# Patient Record
Sex: Male | Born: 1945 | Race: White | Hispanic: No | Marital: Married | Smoking: Never smoker
Health system: Southern US, Community
[De-identification: ages and names within clinical notes are randomized; demographics above are authoritative.]

## PROBLEM LIST (undated history)

## (undated) DIAGNOSIS — K219 Gastro-esophageal reflux disease without esophagitis: Secondary | ICD-10-CM

## (undated) DIAGNOSIS — M353 Polymyalgia rheumatica: Secondary | ICD-10-CM

## (undated) DIAGNOSIS — R413 Other amnesia: Principal | ICD-10-CM

## (undated) DIAGNOSIS — C61 Malignant neoplasm of prostate: Secondary | ICD-10-CM

## (undated) DIAGNOSIS — G709 Myoneural disorder, unspecified: Secondary | ICD-10-CM

## (undated) DIAGNOSIS — D689 Coagulation defect, unspecified: Secondary | ICD-10-CM

## (undated) DIAGNOSIS — R29898 Other symptoms and signs involving the musculoskeletal system: Secondary | ICD-10-CM

## (undated) DIAGNOSIS — I341 Nonrheumatic mitral (valve) prolapse: Secondary | ICD-10-CM

## (undated) DIAGNOSIS — M1990 Unspecified osteoarthritis, unspecified site: Secondary | ICD-10-CM

## (undated) HISTORY — DX: Gastro-esophageal reflux disease without esophagitis: K21.9

## (undated) HISTORY — DX: Unspecified osteoarthritis, unspecified site: M19.90

## (undated) HISTORY — DX: Malignant neoplasm of prostate: C61

## (undated) HISTORY — PX: EYE SURGERY: SHX253

## (undated) HISTORY — DX: Other symptoms and signs involving the musculoskeletal system: R29.898

## (undated) HISTORY — DX: Coagulation defect, unspecified: D68.9

## (undated) HISTORY — DX: Other amnesia: R41.3

## (undated) HISTORY — PX: SPINE SURGERY: SHX786

## (undated) HISTORY — PX: PILONIDAL CYST EXCISION: SHX744

---
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
## 1989-01-03 HISTORY — PX: VASECTOMY: SHX75

## 1993-01-03 HISTORY — PX: BACK SURGERY: SHX140

## 1996-01-04 HISTORY — PX: TRANSURETHRAL RESECTION OF PROSTATE: SHX73

## 1998-09-06 ENCOUNTER — Ambulatory Visit (HOSPITAL_COMMUNITY): Admission: RE | Admit: 1998-09-06 | Discharge: 1998-09-06 | Payer: Self-pay | Admitting: Neurosurgery

## 1998-09-06 ENCOUNTER — Encounter: Payer: Self-pay | Admitting: Neurosurgery

## 1999-03-08 ENCOUNTER — Other Ambulatory Visit: Admission: RE | Admit: 1999-03-08 | Discharge: 1999-03-08 | Payer: Self-pay | Admitting: Obstetrics & Gynecology

## 2000-01-04 HISTORY — PX: ROTATOR CUFF REPAIR: SHX139

## 2003-01-04 HISTORY — PX: PROSTATE SURGERY: SHX751

## 2003-10-28 ENCOUNTER — Emergency Department (HOSPITAL_COMMUNITY): Admission: EM | Admit: 2003-10-28 | Discharge: 2003-10-28 | Payer: Self-pay | Admitting: Emergency Medicine

## 2009-08-11 ENCOUNTER — Encounter: Admission: RE | Admit: 2009-08-11 | Discharge: 2009-08-11 | Payer: Self-pay | Admitting: Internal Medicine

## 2010-09-01 ENCOUNTER — Other Ambulatory Visit: Payer: Self-pay | Admitting: Neurology

## 2010-09-01 DIAGNOSIS — R51 Headache: Secondary | ICD-10-CM

## 2010-09-01 DIAGNOSIS — R42 Dizziness and giddiness: Secondary | ICD-10-CM

## 2010-09-03 ENCOUNTER — Ambulatory Visit
Admission: RE | Admit: 2010-09-03 | Discharge: 2010-09-03 | Disposition: A | Discharge status: Home / Self Care | Payer: Medicare Other | Source: Ambulatory Visit | Attending: Neurology | Admitting: Neurology

## 2010-09-03 DIAGNOSIS — R42 Dizziness and giddiness: Secondary | ICD-10-CM

## 2010-09-03 DIAGNOSIS — R51 Headache: Secondary | ICD-10-CM

## 2010-09-16 ENCOUNTER — Other Ambulatory Visit: Payer: Self-pay | Admitting: Neurology

## 2010-09-16 DIAGNOSIS — R519 Headache, unspecified: Secondary | ICD-10-CM

## 2010-09-16 DIAGNOSIS — R42 Dizziness and giddiness: Secondary | ICD-10-CM

## 2010-09-23 ENCOUNTER — Ambulatory Visit
Admission: RE | Admit: 2010-09-23 | Discharge: 2010-09-23 | Disposition: A | Discharge status: Home / Self Care | Payer: Medicare Other | Source: Ambulatory Visit | Attending: Neurology | Admitting: Neurology

## 2010-09-23 DIAGNOSIS — R519 Headache, unspecified: Secondary | ICD-10-CM

## 2010-09-23 DIAGNOSIS — R42 Dizziness and giddiness: Secondary | ICD-10-CM

## 2010-09-23 MED ORDER — GADOBENATE DIMEGLUMINE 529 MG/ML IV SOLN
15.0000 mL | Freq: Once | INTRAVENOUS | Status: AC | PRN
  Administered 2010-09-23: 15 mL via INTRAVENOUS

## 2013-04-19 ENCOUNTER — Other Ambulatory Visit: Payer: Self-pay | Admitting: Orthopedic Surgery

## 2013-04-22 NOTE — Progress Notes (Signed)
Pt aware of where to come-bring meds and ins/id

## 2013-04-24 NOTE — H&P (Signed)
   Jay Ayers is a 68 year old retired [employer]. He has had a mass beneath his right thumb nail on the radial sterile matrix dating back to June of 2014. He has been treated with an antifungal without success. He has a mass consistent with either a verrucous lesion or possibly a squamous cell skin cancer.   He is sent for a surgical opinion regarding possible biopsy or other treatment alternatives.   His past history is reviewed in detail. He is 5'9", 150 lbs. He is not taking any prescription medication for his thumb nail predicament. He reports no drug allergies. Current medications include Prednisone 5 mg daily, Celebrex 200 mg daily, and skin lotions. Prior surgery includes appendectomy, pilonidal cyst excision, prostatectomy, tonsillectomy, and shoulder surgery. His social history reveals he is married. He is a nonsmoker. He enjoys a rare alcoholic beverage. His family history is detailed and positive for primary relatives with high BP, coronary artery disease and arthritis. A 14 system review of systems reveals corrective lenses, hearing impairment, cough, easy bleeding and bruising.  Physical examination reveals a very pleasant 68 year old gentleman. Inspection of his thumb reveals an irregular mass beneath the radial nail plate. He does have a recess and scarring of his sterile matrix. His nail plate appears to be growing normally from beneath the nail fold. He has full ROM of his MP and IP joints.  We had a detailed discussion regarding his predicament. The differential diagnosis includes a verrucous lesion, a foreign body or another soft tissue tumor only able to be diagnosed by a proper biopsy and a chronic fungal infection.   I have advised him it is in his best interest to proceed with biopsy of his nail mass under local anesthesia.  We will remove the nail plate, perform a biopsy and appropriately repair his nail matrix.   I have explained to him that he will never have a normal nail  plate/nail fold despite our best effort. He may have a residual area of scarred sterile nail matrix that will have difficulties with adherence of the nail plate and could have foreign material collect.   Our main goal is to obtain a proper diagnosis so that we may prescribe optimum treatment.

## 2013-04-25 ENCOUNTER — Encounter (HOSPITAL_BASED_OUTPATIENT_CLINIC_OR_DEPARTMENT_OTHER)
Admission: RE | Disposition: A | Discharge status: Home / Self Care | Payer: Self-pay | Source: Ambulatory Visit | Attending: Orthopedic Surgery

## 2013-04-25 ENCOUNTER — Ambulatory Visit (HOSPITAL_BASED_OUTPATIENT_CLINIC_OR_DEPARTMENT_OTHER)
Admission: RE | Admit: 2013-04-25 | Discharge: 2013-04-25 | Disposition: A | Discharge status: Home / Self Care | Payer: Medicare Other | Source: Ambulatory Visit | Attending: Orthopedic Surgery | Admitting: Orthopedic Surgery

## 2013-04-25 ENCOUNTER — Encounter (HOSPITAL_BASED_OUTPATIENT_CLINIC_OR_DEPARTMENT_OTHER): Payer: Self-pay

## 2013-04-25 DIAGNOSIS — B351 Tinea unguium: Secondary | ICD-10-CM | POA: Insufficient documentation

## 2013-04-25 HISTORY — DX: Myoneural disorder, unspecified: G70.9

## 2013-04-25 HISTORY — PX: MINOR NAILBED REPAIR: SHX5979

## 2013-04-25 SURGERY — MINOR NAILBED REPAIR
Anesthesia: LOCAL | Site: Thumb | Laterality: Right

## 2013-04-25 MED ORDER — LIDOCAINE HCL 2 % IJ SOLN
INTRAMUSCULAR | Status: DC | PRN
  Administered 2013-04-25: 5 mL

## 2013-04-25 MED ORDER — ACETAMINOPHEN-CODEINE #3 300-30 MG PO TABS: 1.0000 | ORAL_TABLET | ORAL | Status: DC | PRN

## 2013-04-25 SURGICAL SUPPLY — 46 items
BANDAGE ADH SHEER 1  50/CT (GAUZE/BANDAGES/DRESSINGS) IMPLANT
BANDAGE COBAN STERILE 2 (GAUZE/BANDAGES/DRESSINGS) IMPLANT
BLADE 15 SAFETY STRL DISP (BLADE) ×2 IMPLANT
BLADE SURG 15 STRL LF DISP TIS (BLADE) ×1 IMPLANT
BLADE SURG 15 STRL SS (BLADE) ×1
BNDG COHESIVE 1X5 TAN STRL LF (GAUZE/BANDAGES/DRESSINGS) ×2 IMPLANT
BNDG ELASTIC 2 VLCR STRL LF (GAUZE/BANDAGES/DRESSINGS) IMPLANT
BNDG ESMARK 4X9 LF (GAUZE/BANDAGES/DRESSINGS) IMPLANT
BRUSH SCRUB EZ PLAIN DRY (MISCELLANEOUS) ×2 IMPLANT
CORDS BIPOLAR (ELECTRODE) IMPLANT
COVER MAYO STAND STRL (DRAPES) ×2 IMPLANT
CUFF TOURNIQUET SINGLE 18IN (TOURNIQUET CUFF) IMPLANT
DECANTER SPIKE VIAL GLASS SM (MISCELLANEOUS) IMPLANT
DRAIN PENROSE 1/2X12 LTX STRL (WOUND CARE) ×2 IMPLANT
DRAPE SURG 17X23 STRL (DRAPES) ×2 IMPLANT
GAUZE XEROFORM 1X8 LF (GAUZE/BANDAGES/DRESSINGS) ×2 IMPLANT
GLOVE BIO SURGEON STRL SZ 6.5 (GLOVE) ×2 IMPLANT
GLOVE BIO SURGEON STRL SZ7.5 (GLOVE) ×2 IMPLANT
GLOVE BIOGEL M STRL SZ7.5 (GLOVE) IMPLANT
GLOVE BIOGEL PI IND STRL 7.0 (GLOVE) ×1 IMPLANT
GLOVE BIOGEL PI IND STRL 8 (GLOVE) ×1 IMPLANT
GLOVE BIOGEL PI INDICATOR 7.0 (GLOVE) ×1
GLOVE BIOGEL PI INDICATOR 8 (GLOVE) ×1
GLOVE ECLIPSE 6.5 STRL STRAW (GLOVE) ×2 IMPLANT
GLOVE ORTHO TXT STRL SZ7.5 (GLOVE) ×2 IMPLANT
GOWN STRL REUS W/ TWL LRG LVL3 (GOWN DISPOSABLE) ×2 IMPLANT
GOWN STRL REUS W/ TWL XL LVL3 (GOWN DISPOSABLE) ×1 IMPLANT
GOWN STRL REUS W/TWL LRG LVL3 (GOWN DISPOSABLE) ×2
GOWN STRL REUS W/TWL XL LVL3 (GOWN DISPOSABLE) ×1
NEEDLE 27GAX1X1/2 (NEEDLE) ×2 IMPLANT
PACK BASIN DAY SURGERY FS (CUSTOM PROCEDURE TRAY) ×2 IMPLANT
PADDING CAST ABS 4INX4YD NS (CAST SUPPLIES)
PADDING CAST ABS COTTON 4X4 ST (CAST SUPPLIES) IMPLANT
SPONGE GAUZE 4X4 12PLY (GAUZE/BANDAGES/DRESSINGS) ×2 IMPLANT
SPONGE GAUZE 4X4 12PLY STER LF (GAUZE/BANDAGES/DRESSINGS) IMPLANT
STOCKINETTE 4X48 STRL (DRAPES) ×2 IMPLANT
STRIP CLOSURE SKIN 1/2X4 (GAUZE/BANDAGES/DRESSINGS) IMPLANT
SUT CHROMIC 6 0 PS 4 (SUTURE) ×2 IMPLANT
SUT ETHILON 5 0 P 3 18 (SUTURE)
SUT NYLON ETHILON 5-0 P-3 1X18 (SUTURE) IMPLANT
SUT PROLENE 4 0 P 3 18 (SUTURE) IMPLANT
SYR 3ML 23GX1 SAFETY (SYRINGE) IMPLANT
SYR CONTROL 10ML LL (SYRINGE) ×2 IMPLANT
TOWEL OR 17X24 6PK STRL BLUE (TOWEL DISPOSABLE) ×4 IMPLANT
TRAY DSU PREP LF (CUSTOM PROCEDURE TRAY) ×2 IMPLANT
UNDERPAD 30X30 INCONTINENT (UNDERPADS AND DIAPERS) ×2 IMPLANT

## 2013-04-25 NOTE — Brief Op Note (Signed)
04/25/2013  12:01 PM  PATIENT:  Jay Ayers  68 y.o. male  PRE-OPERATIVE DIAGNOSIS:  right thumb nail lesion verruca?  POST-OPERATIVE DIAGNOSIS:  right thumb nail lesion verruca vs mitotic lesion  PROCEDURE:  Procedure(s) with comments: RIGHT THUMB NAIL BIOPSY    (MINOR PROCEDURE) (Right) - clean class  SURGEON:  Surgeon(s) and Role:    * Cammie Sickle., MD - Primary  PHYSICIAN ASSISTANT:   ASSISTANTS: nurse   ANESTHESIA:   local  EBL:     BLOOD ADMINISTERED:none  DRAINS: none   LOCAL MEDICATIONS USED:  XYLOCAINE   SPECIMEN:  Biopsy / Limited Resection  DISPOSITION OF SPECIMEN:  PATHOLOGY  COUNTS:  YES  TOURNIQUET:    DICTATION: .Other Dictation: Dictation Number [id number]  PLAN OF CARE: Discharge to home after PACU  PATIENT DISPOSITION:  PACU - hemodynamically stable.   Delay start of Pharmacological VTE agent (>24hrs) due to surgical blood loss or risk of bleeding: not applicable

## 2013-04-25 NOTE — Op Note (Signed)
[id number]  

## 2013-04-25 NOTE — Discharge Instructions (Signed)

## 2013-04-26 NOTE — Op Note (Signed)
[name], QUERRY NO.:  [id number]  MEDICAL RECORD NO.:  [id number]  LOCATION:                                 FACILITY:  PHYSICIAN:  Jay Ayers, M.D.      DATE OF BIRTH:  DATE OF PROCEDURE:  04/25/2013 DATE OF DISCHARGE:                              OPERATIVE REPORT   PREOPERATIVE DIAGNOSIS:  Subungual lesion, right thumb nail causing nail deformity, rule out chronic fungal infection, rule out verruca, and rule out mitotic lesion.  POSTOPERATIVE DIAGNOSIS:  Uncertain pending histopathologic evaluation of biopsy.  OPERATION:  Full-thickness nail matrix biopsy right thumb with reconstruction of sterile nail matrix.  OPERATING SURGEON:  Jay Ayers, M.D.  ASSISTANT:  Nurse.  ANESTHESIA:  2% lidocaine metacarpal head block, right thumb.  This was performed as a minor operating procedure.  ANESTHETIST:  Jay Ayers, M.D.  INDICATIONS:  Jay Ayers is a 68 year old gentleman referred through the courtesy of Dr. Harriett Ayers of [facility] for evaluation and management of a subungual lesion of the right thumb nail.  Jay Ayers has had an abnormal appearing nail for multiple months with separation of the nail plate from the nail fold and a keratinous plug beneath the nail plate.  He was evaluated by Dr. Elvera Ayers and sent for a hand surgery consult.  We advised nail removal, biopsy of the nail matrix, and appropriate histopathology, and PAS stains.  Jay Ayers presents for biopsy under local anesthesia conditions at this time.  Preoperatively, he was interviewed in the holding area.  His right hand and arm were identified as the proper surgical site.  Following informed consent and Betadine prep of his hand, 2% lidocaine was infiltrated at metacarpal head level to obtain a digital block.  After 10 minutes, excellent anesthesia was achieved.  The right hand and arm were then prepped with Betadine soap  and solution, sterilely draped.  The thumb was exsanguinated with a gauze wrap and direct compression followed by application of a half-inch Penrose drain at the base of the proximal phalanx as a digital tourniquet.  Following routine surgical time-out, the nail was removed with a Soil scientist and hemostat, the nail plate was cleared of hypertrophic keratinous material and soaked in Betadine.  We identified the lesion and performed a marginal resection of the entire Jay Ayers is plugged as well as the matrix proximal to the plug.  This was passed directly in formalin for pathologic evaluation.  A small area proximal to the plug was excised to see if there were a histopathologic changes of the germinal nail matrix.  The sterile matrix was then elevated with a scalpel and repaired primarily with interrupted suture of 6-0 chromic.  Anatomic repair was achieved.  Due to the fact that the nail plate was involved and appeared to have some fungal type changes, I elected to discard the nail plate and dress the sterile matrix with Xeroflo.  There were no apparent complications.  Jay Ayers' thumb was then placed in compressive dressing with sterile gauze and Coban.  He is encouraged to elevate his hand for 24 hours.  He is provided prescription for Tylenol with Codeine No. 3  one or two p.o. q.4-6 hours p.r.n. pain, 30 tablets without refill.  We will see him back for followup in 5 days for a dressing change and discussion of his biopsy results.     Jay Ayers, M.D.     RVS/MEDQ  D:  04/25/2013  T:  04/26/2013  Job:  [id number]  cc:   Jay Sine, MD

## 2013-04-30 ENCOUNTER — Encounter (HOSPITAL_BASED_OUTPATIENT_CLINIC_OR_DEPARTMENT_OTHER): Payer: Self-pay | Admitting: Orthopedic Surgery

## 2013-08-06 ENCOUNTER — Ambulatory Visit (INDEPENDENT_AMBULATORY_CARE_PROVIDER_SITE_OTHER): Payer: Medicare Other | Admitting: Neurology

## 2013-08-06 ENCOUNTER — Encounter: Payer: Self-pay | Admitting: Neurology

## 2013-08-06 VITALS — BP 114/66 | HR 64 | Ht 70.0 in | Wt 154.0 lb

## 2013-08-06 DIAGNOSIS — D513 Other dietary vitamin B12 deficiency anemia: Secondary | ICD-10-CM

## 2013-08-06 DIAGNOSIS — R413 Other amnesia: Secondary | ICD-10-CM | POA: Insufficient documentation

## 2013-08-06 DIAGNOSIS — D518 Other vitamin B12 deficiency anemias: Secondary | ICD-10-CM

## 2013-08-06 DIAGNOSIS — R6889 Other general symptoms and signs: Secondary | ICD-10-CM

## 2013-08-06 HISTORY — DX: Other amnesia: R41.3

## 2013-08-06 NOTE — Progress Notes (Signed)
Reason for visit: Memory disturbance  Jay Ayers is a 68 y.o. male  History of present illness:  Jay Ayers is a 68 year old right-handed white male with a history of a progressive memory disturbance that has been present for least one to 2 years. The patient has had increasing problems with remembering names for people, and keeping up with appointments. He has had problems with math functions, and misplacing things about the house. While driving, he finds that he needs to focus harder in order to maintain safety. The patient finds that he is more distractible. He does not remember conversations that occurred with other family members. While reading books, he has difficulty keeping up with the plot and various characters in the book. The patient does have some slight fatigue, and he gives a history of polymyalgia rheumatica, and he indicates that he recently tapered off of his prednisone. The patient has had some recent headaches, but he attributes this to a new prescription for glasses. He continues to have dizziness for which he was seen in August of 2012. MRI of the brain showed a small lesion in the cerebellar vermis. The patient denies any nausea or vomiting. He is sent to this office for an evaluation.   Past Medical History  Diagnosis Date  . Neuromuscular disorder   . GERD (gastroesophageal reflux disease)   . Arthritis   . Weakness of back   . Prostate cancer   . Memory disturbance 08/06/2013    Past Surgical History  Procedure Laterality Date  . Appendectomy  1958  . Prostate surgery  2005  . Back surgery  1995  . Minor nailbed repair Right 04/25/2013    Procedure: RIGHT THUMB NAIL BIOPSY    (MINOR PROCEDURE);  Surgeon: Cammie Sickle., MD;  Location: [facility];  Service: Orthopedics;  Laterality: Right;  clean class  . Rotator cuff repair  2002  . Transurethral resection of prostate  1998  . Vasectomy  1991  . Tonsillectomy  1964    Family  History  Problem Relation Age of Onset  . Stroke Mother   . Heart failure Mother   . Multiple sclerosis    . Prostate cancer    . Heart Problems    . Emphysema Father   . Multiple sclerosis Brother   . Heart Problems Brother   . Prostate cancer Brother   . Leukemia Brother     Social history:  reports that he has never smoked. He has never used smokeless tobacco. He reports that he drinks alcohol. He reports that he does not use illicit drugs.  Medications:  No current outpatient prescriptions on file prior to visit.   No current facility-administered medications on file prior to visit.      Allergies  Allergen Reactions  . Nitrofurantoin Rash    ROS:  Out of a complete 14 system review of symptoms, the patient complains only of the following symptoms, and all other reviewed systems are negative.  Fatigue  Ringing in the ears, dizziness Moles Snoring Blood in the stool, constipation Impotence Easy bleeding Joint pain, achy muscles Memory loss, headache, dizziness Decreased energy Sleepiness  Blood pressure 114/66, pulse 64, height 5\' 10"  (1.778 m), weight 154 lb (69.854 kg).  Physical Exam  General: The patient is alert and cooperative at the time of the examination.  Eyes: Pupils are equal, round, and reactive to light. Discs are flat bilaterally.  Neck: The neck is supple, no carotid bruits are  noted.  Respiratory: The respiratory examination is clear.  Cardiovascular: The cardiovascular examination reveals a regular rate and rhythm, no obvious murmurs or rubs are noted.  Skin: Extremities are without significant edema.  Neurologic Exam  Mental status: The Mini-Mental status examination done today shows a total score 26/30.  Cranial nerves: Facial symmetry is present. There is good sensation of the face to pinprick and soft touch bilaterally. The strength of the facial muscles and the muscles to head turning and shoulder shrug are normal bilaterally.  Speech is well enunciated, no aphasia or dysarthria is noted. Extraocular movements are full. Visual fields are full. The tongue is midline, and the patient has symmetric elevation of the soft palate. No obvious hearing deficits are noted.  Motor: The motor testing reveals 5 over 5 strength of all 4 extremities. Good symmetric motor tone is noted throughout.  Sensory: Sensory testing is intact to pinprick, soft touch, vibration sensation, and position sense on all 4 extremities, with exception that there is some slight decreased pinprick sensation on the left medial leg below the knee. No evidence of extinction is noted.  Coordination: Cerebellar testing reveals good finger-nose-finger and heel-to-shin bilaterally.  Gait and station: Gait is normal. Tandem gait is normal. Romberg is negative. No drift is seen.  Reflexes: Deep tendon reflexes are symmetric and normal bilaterally. Toes are downgoing bilaterally.    MRI brain 09/03/10:  Impression: Abnormal MRI brain (without contrast) demonstrating:  1. There is a 50mm focus of magnetic susceptibility in the left parasagittal cerebellum, adjacent to the fourth ventricle on gradient-echo views, which may represent dystrophic calcification or hemosiderin deposit from chronic cerebral microhemorrhage. 2. Non-specific solitary left frontal subcortical focus of gliosis.   Assessment/Plan:  One. Memory disturbance  2. History of polymyalgia rheumatica  3. History of dizziness  The patient has developed a progressive memory disorder over the last 1-2 years. He will be set up for MRI evaluation of the brain once again, and he will have some blood work today. Depending upon the results above, we may consider use of medication such as Aricept. He will followup in about 6-8 months.  Jill Alexanders MD 08/06/2013 8:13 PM  [facility] [address] [place], [place] [address]  Phone [phone] Fax [phone]

## 2013-08-07 LAB — TSH: TSH: 1.52 u[IU]/mL (ref 0.450–4.500)

## 2013-08-07 LAB — VITAMIN B12: Vitamin B-12: 935 pg/mL (ref 211–946)

## 2013-08-07 LAB — SEDIMENTATION RATE: SED RATE: 3 mm/h (ref 0–30)

## 2013-08-07 LAB — RPR: RPR: NONREACTIVE

## 2013-08-15 ENCOUNTER — Ambulatory Visit
Admission: RE | Admit: 2013-08-15 | Discharge: 2013-08-15 | Disposition: A | Discharge status: Home / Self Care | Payer: Medicare Other | Source: Ambulatory Visit | Attending: Neurology | Admitting: Neurology

## 2013-08-15 DIAGNOSIS — R413 Other amnesia: Secondary | ICD-10-CM

## 2013-08-20 ENCOUNTER — Telehealth: Payer: Self-pay | Admitting: Neurology

## 2013-08-20 NOTE — Telephone Encounter (Signed)
I called patient. The MRI the brain is relatively unremarkable, small area of hemosiderin in the left cerebellum that was seen previously in 2012, no change. Blood work was unremarkable. If the patient wishes to go on Aricept, I will call in a prescription for him.   MRI brain 08/15/2013:  IMPRESSION: 1. No acute intracranial abnormality. 2. Negative for age MRI appearance the brain, except for a small chronic focus of hemorrhage in the medial left cerebellum.

## 2013-09-24 ENCOUNTER — Other Ambulatory Visit: Payer: Self-pay | Admitting: Internal Medicine

## 2013-09-24 DIAGNOSIS — M545 Low back pain, unspecified: Secondary | ICD-10-CM

## 2013-10-01 ENCOUNTER — Ambulatory Visit
Admission: RE | Admit: 2013-10-01 | Discharge: 2013-10-01 | Disposition: A | Discharge status: Home / Self Care | Payer: Medicare Other | Source: Ambulatory Visit | Attending: Internal Medicine | Admitting: Internal Medicine

## 2013-10-01 DIAGNOSIS — M545 Low back pain, unspecified: Secondary | ICD-10-CM

## 2013-11-20 ENCOUNTER — Encounter: Payer: Self-pay | Admitting: Neurology

## 2013-11-26 ENCOUNTER — Encounter: Payer: Self-pay | Admitting: Neurology

## 2014-02-06 ENCOUNTER — Encounter: Payer: Self-pay | Admitting: Neurology

## 2014-02-06 ENCOUNTER — Ambulatory Visit (INDEPENDENT_AMBULATORY_CARE_PROVIDER_SITE_OTHER): Payer: 59 | Admitting: Neurology

## 2014-02-06 VITALS — BP 128/68 | HR 56 | Ht 68.0 in | Wt 158.0 lb

## 2014-02-06 DIAGNOSIS — R413 Other amnesia: Secondary | ICD-10-CM

## 2014-02-06 MED ORDER — RIVASTIGMINE 4.6 MG/24HR TD PT24: 4.6000 mg | MEDICATED_PATCH | Freq: Every day | TRANSDERMAL | Status: DC

## 2014-02-06 NOTE — Patient Instructions (Signed)
   We will try the Exelon patch. If you do well on May 4 .6 mg patch for one month, call our office to get a prescription for the maintenance dose of the 9.5 mg patch.

## 2014-02-06 NOTE — Progress Notes (Signed)
Reason for visit: Memory disturbance  Jay Ayers is an 69 y.o. male  History of present illness:   Jay Ayers is a 69 year old right-handed white male with a history of a progressive memory disturbance. The patient returns to the office today indicating that he continues to have some decline in his cognitive status. He makes simple errors, and he has some word finding problems. He has not given up any activities of daily living because of memory. He has not noted any new medical issues that have come up since last seen. He is interested in going on medications for memory. He indicates that he has a lot of heartburn issues and he is concerned that medication such as Aricept may upset his stomach.   Past Medical History  Diagnosis Date  . Neuromuscular disorder   . GERD (gastroesophageal reflux disease)   . Arthritis   . Weakness of back   . Prostate cancer   . Memory disturbance 08/06/2013    Past Surgical History  Procedure Laterality Date  . Appendectomy  1958  . Prostate surgery  2005  . Back surgery  1995  . Minor nailbed repair Right 04/25/2013    Procedure: RIGHT THUMB NAIL BIOPSY    (MINOR PROCEDURE);  Surgeon: Cammie Sickle., MD;  Location: [facility];  Service: Orthopedics;  Laterality: Right;  clean class  . Rotator cuff repair  2002  . Transurethral resection of prostate  1998  . Vasectomy  1991  . Tonsillectomy  1964    Family History  Problem Relation Age of Onset  . Stroke Mother   . Heart failure Mother   . Multiple sclerosis    . Prostate cancer    . Heart Problems    . Emphysema Father   . Multiple sclerosis Brother   . Heart Problems Brother   . Prostate cancer Brother   . Leukemia Brother     Social history:  reports that he has never smoked. He has never used smokeless tobacco. He reports that he drinks alcohol. He reports that he does not use illicit drugs.    Allergies  Allergen Reactions  . Nitrofurantoin Rash     Medications:  Current Outpatient Prescriptions on File Prior to Visit  Medication Sig Dispense Refill  . Calcium-Magnesium-Vitamin D (CALCIUM 500 PO) Take 500 mg by mouth daily.    . meloxicam (MOBIC) 15 MG tablet Take 15 mg by mouth as needed.    . Multiple Vitamin (MULTIVITAMIN) tablet Take 1 tablet by mouth daily.     No current facility-administered medications on file prior to visit.    ROS:  Out of a complete 14 system review of symptoms, the patient complains only of the following symptoms, and all other reviewed systems are negative.  Testicular pain  Back pain  Blood pressure 128/68, pulse 56, height 5\' 8"  (1.727 m), weight 158 lb (71.668 kg).  Physical Exam  General: The patient is alert and cooperative at the time of the examination.  Skin: No significant peripheral edema is noted.   Neurologic Exam  Mental status: The patient is oriented x 3. Mini-Mental Status Examination done today shows a total score 29/30.  Cranial nerves: Facial symmetry is present. Speech is normal, no aphasia or dysarthria is noted. Extraocular movements are full. Visual fields are full.  Motor: The patient has good strength in all 4 extremities.  Sensory examination: Soft touch sensation is symmetric on the face, arms, and legs.  Coordination: The patient has good finger-nose-finger and heel-to-shin bilaterally.  Gait and station: The patient has a normal gait. Tandem gait is normal. Romberg is negative. No drift is seen.  Reflexes: Deep tendon reflexes are symmetric.   Assessment/Plan:  1. Minimum cognitive impairment  The patient is having some issues with memory, he believes that he is getting worse over time. He will be started on the Exelon patch, he will call our office in one month if he is tolerating the 4.6 mg dose, we will increase to the 9.5 mg dose. He is interested in learning more about research regarding dementia.   Jill Alexanders MD 02/06/2014 9:02  PM  [facility] [address] [place], [place] [address]  Phone [phone] Fax [phone]

## 2014-02-13 ENCOUNTER — Telehealth: Payer: Self-pay

## 2014-02-14 ENCOUNTER — Telehealth: Payer: Self-pay

## 2014-02-14 MED ORDER — RIVASTIGMINE TARTRATE 1.5 MG PO CAPS: ORAL_CAPSULE | ORAL | Status: DC

## 2014-02-14 NOTE — Telephone Encounter (Signed)
I called patient. The Exelon patches are too expensive, we will go to the capsules.

## 2014-02-14 NOTE — Telephone Encounter (Signed)
Patient would like to know if he can be changed from Exelon patches to Exelon capsules. Says the capsules are approx $200 less that the patches through his ins plan.  Please advise.  Thank you.

## 2014-04-14 ENCOUNTER — Telehealth: Payer: Self-pay | Admitting: Neurology

## 2014-04-14 NOTE — Telephone Encounter (Signed)
I called patient back. He thinks that there may be in a minor abrasion where the bleeding occurred. I have indicated that if he continues to have episodes of unexplained bleeding, he will need to have blood work done.

## 2014-04-14 NOTE — Telephone Encounter (Signed)
I called the patient. The Exelon should not cause bleeding problems. The prednisone can certainly cause easy bruising. If he is having spontaneous bleeding, blood work should be done looking at the clotting factors and looking at a CBC to ensure that there is nothing serious going on.

## 2014-04-14 NOTE — Telephone Encounter (Signed)
Pt is calling stating he on rivastigmine (EXELON) 1.5 MG capsule and now he has bleeding on his arms.  He stopped taking this for a few weeks now and he was taking prednisone as well.  He wants to know if this was an interaction from taking both meds together.  Please call and advise.

## 2014-04-14 NOTE — Telephone Encounter (Signed)
Patient stated he's been off Exelon for three weeks and questioning if he could resume taking medication.  He prefers to see if bleeding issue is a recurrence after taking Exelon before having blood work drawn.  He stated he scraped something that caused an abrasion and caused the bleeding.  Please call cell # [phone] and advise.

## 2014-06-17 ENCOUNTER — Encounter: Payer: Self-pay | Admitting: Neurology

## 2014-06-17 ENCOUNTER — Telehealth: Payer: Self-pay | Admitting: Neurology

## 2014-06-17 ENCOUNTER — Ambulatory Visit (INDEPENDENT_AMBULATORY_CARE_PROVIDER_SITE_OTHER): Payer: Medicare Other | Admitting: Neurology

## 2014-06-17 VITALS — BP 119/63 | HR 52 | Ht 69.0 in | Wt 150.4 lb

## 2014-06-17 DIAGNOSIS — R413 Other amnesia: Secondary | ICD-10-CM | POA: Diagnosis not present

## 2014-06-17 MED ORDER — DONEPEZIL HCL 5 MG PO TABS: 5.0000 mg | ORAL_TABLET | Freq: Every day | ORAL | Status: DC

## 2014-06-17 NOTE — Telephone Encounter (Signed)
Patient called requesting to be seen sooner than 08/12/14. Patient feels that his condition is getting worse. I did not see any opening sooner than 08/12/14. Please call and advise. Patient can be reached @ [phone]

## 2014-06-17 NOTE — Patient Instructions (Signed)
   Begin Aricept (donepezil) at 5 mg at night for one month. If this medication is well-tolerated, please call our office and we will call in a prescription for the 10 mg tablets. Look out for side effects that may include nausea, diarrhea, weight loss, or stomach cramps. This medication will also cause a runny nose, therefore there is no need for allergy medications for this purpose.  

## 2014-06-17 NOTE — Telephone Encounter (Signed)
I spoke to the patient. He feels like the medication is not helping and that his memory is getting worse. He requested to come in as soon as possible. Appointment scheduled for today at 3:30.

## 2014-06-17 NOTE — Progress Notes (Signed)
Reason for visit: Memory disorder  Jay Ayers is an 69 y.o. male  History of present illness:  [name] is a 69 year old right-handed white male with a history of minimum cognitive impairment. The patient is continuing to notice mild memory issues. He might misplace something in the house, he might lose his glasses or have difficulty recalling telephone numbers. He remains physically and mentally active, reading quite a bit. He is on low-dose Exelon taking the 1.5 mg tablets twice daily. He is tolerating medications fairly well, but he has noticed some troubles with dizziness when he stands up. He has not had any fainting episodes. He indicates that he is interested in Alzheimer's research. He returns to the office today for an evaluation.  Past Medical History  Diagnosis Date  . Neuromuscular disorder   . GERD (gastroesophageal reflux disease)   . Arthritis   . Weakness of back   . Prostate cancer   . Memory disturbance 08/06/2013    Past Surgical History  Procedure Laterality Date  . Appendectomy  1958  . Prostate surgery  2005  . Back surgery  1995  . Minor nailbed repair Right 04/25/2013    Procedure: RIGHT THUMB NAIL BIOPSY    (MINOR PROCEDURE);  Surgeon: Cammie Sickle., MD;  Location: [facility];  Service: Orthopedics;  Laterality: Right;  clean class  . Rotator cuff repair  2002  . Transurethral resection of prostate  1998  . Vasectomy  1991  . Tonsillectomy  1964    Family History  Problem Relation Age of Onset  . Stroke Mother   . Heart failure Mother   . Multiple sclerosis    . Prostate cancer    . Heart Problems    . Emphysema Father   . Multiple sclerosis Brother   . Heart Problems Brother   . Prostate cancer Brother   . Leukemia Brother     Social history:  reports that he has never smoked. He has never used smokeless tobacco. He reports that he drinks about 0.6 oz of alcohol per week. He reports that he does not use illicit  drugs.    Allergies  Allergen Reactions  . Nitrofurantoin Rash    Medications:  Prior to Admission medications   Medication Sig Start Date End Date Taking? Authorizing Provider  Calcium-Magnesium-Vitamin D (CALCIUM 500 PO) Take 500 mg by mouth daily.   Yes Historical Provider, MD  Multiple Vitamin (MULTIVITAMIN) tablet Take 1 tablet by mouth daily.   Yes Historical Provider, MD  rivastigmine (EXELON) 1.5 MG capsule One capsule twice daily for one month, then take 2 capsules twice daily 02/14/14  Yes Kathrynn Ducking, MD    ROS:  Out of a complete 14 system review of symptoms, the patient complains only of the following symptoms, and all other reviewed systems are negative.  Memory loss, dizziness Confusion, decreased concentration  Blood pressure 119/63, pulse 52, height 5\' 9"  (1.753 m), weight 150 lb 6.4 oz (68.221 kg).  Physical Exam  General: The patient is alert and cooperative at the time of the examination.  Skin: No significant peripheral edema is noted.   Neurologic Exam  Mental status: The patient is alert and oriented x 3 at the time of the examination. The patient has apparent normal recent and remote memory, with an apparently normal attention span and concentration ability. Mini-Mental Status Examination done today shows a total score 30/30. The patient is able to name 15 animals in 30  seconds.   Cranial nerves: Facial symmetry is present. Speech is normal, no aphasia or dysarthria is noted. Extraocular movements are full. Visual fields are full.  Motor: The patient has good strength in all 4 extremities.  Sensory examination: Soft touch sensation is symmetric on the face, arms, and legs.  Coordination: The patient has good finger-nose-finger and heel-to-shin bilaterally.  Gait and station: The patient has a normal gait. Tandem gait is normal. Romberg is negative. No drift is seen.  Reflexes: Deep tendon reflexes are symmetric.   Assessment/Plan:  1.  Minimum cognitive impairment  The patient is interested in learning more about Alzheimer's research. I will have our research coordinator contact him. The patient will be switched over to Aricept from Exelon to improve the financial aspects of his medication budget. He will follow-up in about 6 months.  Jill Alexanders MD 06/17/2014 7:12 PM  [facility] [address] [place], [place] [address]  Phone [phone] Fax [phone]

## 2014-06-29 ENCOUNTER — Ambulatory Visit (INDEPENDENT_AMBULATORY_CARE_PROVIDER_SITE_OTHER): Payer: Medicare Other | Admitting: Family Medicine

## 2014-06-29 ENCOUNTER — Telehealth: Payer: Self-pay

## 2014-06-29 VITALS — BP 124/82 | HR 49 | Temp 97.5°F | Resp 18 | Ht 69.5 in | Wt 150.0 lb

## 2014-06-29 DIAGNOSIS — R001 Bradycardia, unspecified: Secondary | ICD-10-CM | POA: Diagnosis not present

## 2014-06-29 DIAGNOSIS — R079 Chest pain, unspecified: Secondary | ICD-10-CM

## 2014-06-29 LAB — COMPLETE METABOLIC PANEL WITH GFR
ALT: 15 U/L (ref 0–53)
AST: 19 U/L (ref 0–37)
Albumin: 4.3 g/dL (ref 3.5–5.2)
Alkaline Phosphatase: 81 U/L (ref 39–117)
BILIRUBIN TOTAL: 0.6 mg/dL (ref 0.2–1.2)
BUN: 13 mg/dL (ref 6–23)
CALCIUM: 9.5 mg/dL (ref 8.4–10.5)
CO2: 29 mEq/L (ref 19–32)
Chloride: 98 mEq/L (ref 96–112)
Creat: 0.96 mg/dL (ref 0.50–1.35)
GFR, Est African American: >89 mL/min
GFR, Est Non African American: 80 mL/min
Glucose, Bld: 92 mg/dL (ref 70–99)
POTASSIUM: 4.6 meq/L (ref 3.5–5.3)
Sodium: 136 mEq/L (ref 135–145)
Total Protein: 7.6 g/dL (ref 6.0–8.3)

## 2014-06-29 LAB — POCT CBC
Granulocyte percent: 59.8 %G (ref 37–80)
HCT, POC: 48.8 % (ref 43.5–53.7)
HEMOGLOBIN: 15.9 g/dL (ref 14.1–18.1)
Lymph, poc: 1.9 (ref 0.6–3.4)
MCH, POC: 29.3 pg (ref 27–31.2)
MCHC: 32.5 g/dL (ref 31.8–35.4)
MCV: 90.3 fL (ref 80–97)
MID (cbc): 0.7 (ref 0–0.9)
MPV: 8.1 fL (ref 0–99.8)
POC GRANULOCYTE: 3.9 (ref 2–6.9)
POC LYMPH %: 29.2 % (ref 10–50)
POC MID %: 11 % (ref 0–12)
Platelet Count, POC: 242 10*3/uL (ref 142–424)
RBC: 5.41 M/uL (ref 4.69–6.13)
RDW, POC: 13.1 %
WBC: 6.5 10*3/uL (ref 4.6–10.2)

## 2014-06-29 LAB — GLUCOSE, POCT (MANUAL RESULT ENTRY): POC GLUCOSE: 86 mg/dL (ref 70–99)

## 2014-06-29 LAB — TROPONIN I: Troponin I: 0.01 ng/mL (ref <0.06)

## 2014-06-29 MED ORDER — NITROGLYCERIN 0.4 MG SL SUBL: 0.4000 mg | SUBLINGUAL_TABLET | SUBLINGUAL | Status: DC | PRN

## 2014-06-29 NOTE — Telephone Encounter (Signed)
Called patient with lab results for troponin. Patient verbalized understanding. Will wait for appt. For cardiology next week.

## 2014-06-29 NOTE — Progress Notes (Signed)
[facility] [address] [place] [address] [phone]  Date:  06/29/2014   Name:  Jay Ayers   DOB:  1945-11-02   MRN:  [id number]  PCP:  Precious Reel, MD    History of Present Illness:  Jay Ayers is a 69 y.o. male patient who presents to [facility] for chief complaint of chest pain that occurred yesterday. He states that he was going on a 5-6 mile hike when he came across a steep hill- while climbing the hill he noted chest discomfort. He describes it as mild chest tightness.   The pain lasted for a few minutes (not sure but he thinks less than 5) and resolved when he reached a flatter surface. There was associated shortness of breath but no nausea, diaphoresis, or dizziness.  Today he felt tired and had a headache so he decided to come in for eval.  He reports a similar episode of chest pain 3 weeks ago that left him feeling a bit weak and lethargic. He has also noted that he has some bruising over the last year. He states that the least bit of skin trauma will cause bruising. He recently completed a long course of prednisone for polymyalgia rheumatica. He has also changed his medication for memory disturbance from rivastigmine to donepezil a few weeks ago. Primary care physician is Dr. Virgina Jock. He is an avid hiker and exercises normally.  His heart rate generally runs low, but he does note that this is a bit lower than normal.  He has not noted any syncope or pre-syncope, but may get a bit lightheaded when he stands up fast  Notes that his brother and mother both have history of CAD  He is a never smoker, never had a cath- may had had a stress test years ago but he is not really sure.  Never told that he had CAD or other cardiac condition, except for some questionable history of MVP dx in his 51s.    Pulse Readings from Last 3 Encounters:  06/29/14 49  06/17/14 52  02/06/14 56   He took an asa last night and today- 325 that he had at home   Patient Active  Problem List   Diagnosis Date Noted  . Memory disturbance 08/06/2013    Past Medical History  Diagnosis Date  . Neuromuscular disorder   . GERD (gastroesophageal reflux disease)   . Arthritis   . Weakness of back   . Prostate cancer   . Memory disturbance 08/06/2013  . Clotting disorder     Past Surgical History  Procedure Laterality Date  . Appendectomy  1958  . Prostate surgery  2005  . Back surgery  1995  . Minor nailbed repair Right 04/25/2013    Procedure: RIGHT THUMB NAIL BIOPSY    (MINOR PROCEDURE);  Surgeon: Cammie Sickle., MD;  Location: [facility];  Service: Orthopedics;  Laterality: Right;  clean class  . Rotator cuff repair  2002  . Transurethral resection of prostate  1998  . Vasectomy  1991  . Tonsillectomy  1964  . Eye surgery    . Spine surgery      History  Substance Use Topics  . Smoking status: Never Smoker   . Smokeless tobacco: Never Used  . Alcohol Use: 0.6 oz/week    1 Glasses of wine per week     Comment: 2-4 glasses per week    Family History  Problem Relation Age  of Onset  . Stroke Mother   . Heart failure Mother   . Multiple sclerosis    . Prostate cancer    . Heart Problems    . Emphysema Father   . Multiple sclerosis Brother   . Heart Problems Brother   . Prostate cancer Brother   . Leukemia Brother     Allergies  Allergen Reactions  . Nitrofurantoin Rash    Medication list has been reviewed and updated.  Current Outpatient Prescriptions on File Prior to Visit  Medication Sig Dispense Refill  . Calcium-Magnesium-Vitamin D (CALCIUM 500 PO) Take 500 mg by mouth daily.    Marland Kitchen donepezil (ARICEPT) 5 MG tablet Take 1 tablet (5 mg total) by mouth at bedtime. 30 tablet 1  . Multiple Vitamin (MULTIVITAMIN) tablet Take 1 tablet by mouth daily.     No current facility-administered medications on file prior to visit.    ROS ROS otherwise unremarkable unless listed above.   Physical Examination: BP 124/82 mmHg   Pulse 49  Temp(Src) 97.5 F (36.4 C) (Oral)  Resp 18  Ht 5' 9.5" (1.765 m)  Wt 150 lb (68.04 kg)  BMI 21.84 kg/m2  SpO2 97% Ideal Body Weight: Weight in (lb) to have BMI = 25: 171.4  Physical Exam Alert, cooperative, oriented 4. PERRLA with normal conjunctiva. External ear to tympanic membrane are normal bilaterally. Oral mucosa normal. Breath sounds are normal and circulating air well. There is no rhonchi or wheezing. Bradycardia with no rubs or gallops.  Normal radial pulse.  No chest wall tenderness.  No carotid bruit appreciated.  No lower extremity edema.  Results for orders placed or performed in visit on 06/29/14  COMPLETE METABOLIC PANEL WITH GFR  Result Value Ref Range   Sodium 136 135 - 145 mEq/L   Potassium 4.6 3.5 - 5.3 mEq/L   Chloride 98 96 - 112 mEq/L   CO2 29 19 - 32 mEq/L   Glucose, Bld 92 70 - 99 mg/dL   BUN 13 6 - 23 mg/dL   Creat 0.96 0.50 - 1.35 mg/dL   Total Bilirubin 0.6 0.2 - 1.2 mg/dL   Alkaline Phosphatase 81 39 - 117 U/L   AST 19 0 - 37 U/L   ALT 15 0 - 53 U/L   Total Protein 7.6 6.0 - 8.3 g/dL   Albumin 4.3 3.5 - 5.2 g/dL   Calcium 9.5 8.4 - 10.5 mg/dL   GFR, Est African American >89 mL/min   GFR, Est Non African American 80 mL/min  Troponin I  Result Value Ref Range   Troponin I 0.01 <0.06 ng/mL  POCT CBC  Result Value Ref Range   WBC 6.5 4.6 - 10.2 K/uL   Lymph, poc 1.9 0.6 - 3.4   POC LYMPH PERCENT 29.2 10 - 50 %L   MID (cbc) 0.7 0 - 0.9   POC MID % 11.0 0 - 12 %M   POC Granulocyte 3.9 2 - 6.9   Granulocyte percent 59.8 37 - 80 %G   RBC 5.41 4.69 - 6.13 M/uL   Hemoglobin 15.9 14.1 - 18.1 g/dL   HCT, POC 48.8 43.5 - 53.7 %   MCV 90.3 80 - 97 fL   MCH, POC 29.3 27 - 31.2 pg   MCHC 32.5 31.8 - 35.4 g/dL   RDW, POC 13.1 %   Platelet Count, POC 242 142 - 424 K/uL   MPV 8.1 0 - 99.8 fL  POCT glucose (manual entry)  Result Value Ref Range  POC Glucose 86 70 - 99 mg/dl    EKG: sinus bradycardia with rate approx 45 BPM No ST elevation  or depression Assessment and Plan: Chest pain, unspecified chest pain type - Plan: EKG 12-Lead, POCT CBC, COMPLETE METABOLIC PANEL WITH GFR, POCT glucose (manual entry), nitroGLYCERIN (NITROSTAT) 0.4 MG SL tablet, Troponin I, Ambulatory referral to Cardiology  Bradycardia  69 year old generally healthy man here today for chief complaint of chest pain yesterday and another episode about 3 weeks ago.    Discussed with Dr. Rayann Heman, on call for [facility].  He agrees with our plan for stat troponin, if negative plan for cardiology follow-up next week unless sx recur.  He also recommended that pt continue to take aspirin daily and that he have a bottle of nitroglycerin on hand.  Advised pt of this plan and he feels comfortable with this follow-up.   Asked him to please seek care right away if he has any further CP, and we will call him later today with troponin He confirms that he does not use any viagra, etc that might interact with nitroglycerin  Received troponin approx 4pm, CMA Cherina called and let him know it is negative  Ivar Drape, PA-C [facility] 6/26/201612:42 PM  Lamar Blinks MD

## 2014-06-29 NOTE — Patient Instructions (Signed)
We will check your troponin blood test and call you later today with the resuls Continue to take a daily aspirin until the cardiologist tells you to stop. The 81mg  dose is also ok Keep the nitroglycerin on hand and try taking it if you have chest pain again- however in this case also seek care right away!  Do not do any vigorous exercise until you are seen by cardiology- light exercise is ok

## 2014-06-30 DIAGNOSIS — D689 Coagulation defect, unspecified: Secondary | ICD-10-CM | POA: Insufficient documentation

## 2014-06-30 DIAGNOSIS — G709 Myoneural disorder, unspecified: Secondary | ICD-10-CM | POA: Insufficient documentation

## 2014-06-30 DIAGNOSIS — R079 Chest pain, unspecified: Secondary | ICD-10-CM | POA: Insufficient documentation

## 2014-06-30 DIAGNOSIS — C61 Malignant neoplasm of prostate: Secondary | ICD-10-CM | POA: Insufficient documentation

## 2014-06-30 NOTE — Progress Notes (Signed)
Cardiology Office Note   Date:  07/01/2014   ID:  Jay Ayers, DOB [birth year], MRN [id number]  PCP:  Precious Reel, MD  Cardiologist:  Sinclair Grooms, MD   Chief Complaint  Patient presents with  . Chest Pain      History of Present Illness: Jay Ayers is a 69 y.o. male who presents for 23 year old father 35, grandfather, with history of polymyalgia rheumatica, referred because of recent exertional dyspnea and chest tightness.  Jay Ayers has no history of CAD. He is physically very active. He hikes frequently. He exercises aerobically 5 times per week. Over the past month he has had several episodes all walking up an incline of dyspnea greater than expected and chest tightness. The symptom resolves after the activity. On 2 occasions after hiking up fairly steep inclines the discomfort occurs, is relieved with activity, but for several hours after the activity he is felt somewhat weak, had headache, and drained. A cause of this he went to urgent care yesterday where an EKG was performed and cardiac markers were done. No significant abnormalities were noted. He feels fine today.    Past Medical History  Diagnosis Date  . Neuromuscular disorder   . GERD (gastroesophageal reflux disease)   . Arthritis   . Weakness of back   . Prostate cancer   . Memory disturbance 08/06/2013  . Clotting disorder     Past Surgical History  Procedure Laterality Date  . Appendectomy  1958  . Prostate surgery  2005  . Back surgery  1995  . Minor nailbed repair Right 04/25/2013    Procedure: RIGHT THUMB NAIL BIOPSY    (MINOR PROCEDURE);  Surgeon: Cammie Sickle., MD;  Location: [facility];  Service: Orthopedics;  Laterality: Right;  clean class  . Rotator cuff repair  2002  . Transurethral resection of prostate  1998  . Vasectomy  1991  . Tonsillectomy  1964  . Eye surgery    . Spine surgery       Current Outpatient Prescriptions  Medication Sig Dispense Refill   . Calcium-Magnesium-Vitamin D (CALCIUM 500 PO) Take 500 mg by mouth daily.    Marland Kitchen donepezil (ARICEPT) 5 MG tablet Take 1 tablet (5 mg total) by mouth at bedtime. 30 tablet 1  . Multiple Vitamin (MULTIVITAMIN) tablet Take 1 tablet by mouth daily.    . nitroGLYCERIN (NITROSTAT) 0.4 MG SL tablet Place 1 tablet (0.4 mg total) under the tongue every 5 (five) minutes as needed for chest pain. 20 tablet 3  . aspirin EC 81 MG tablet Take 1 tablet (81 mg total) by mouth daily.     No current facility-administered medications for this visit.    Allergies:   Nitrofurantoin    Social History:  The patient  reports that he has never smoked. He has never used smokeless tobacco. He reports that he drinks about 0.6 oz of alcohol per week. He reports that he does not use illicit drugs.   Family History:  The patient's family history includes Emphysema in his father; Heart Problems in his brother and another family member; Heart failure in his mother; Leukemia in his brother; Multiple sclerosis in his brother and another family member; Prostate cancer in his brother and another family member; Stroke in his mother.    ROS:  Please see the history of present illness.   Otherwise, review of systems are positive for polymyalgia rheumatica, excessive fatigue, easy bruising..   All other  systems are reviewed and negative.    PHYSICAL EXAM: VS:  BP 92/52 mmHg  Pulse 52  Ht 5\' 9"  (1.753 m)  Wt 68.493 kg (151 lb)  BMI 22.29 kg/m2  SpO2 91% , BMI Body mass index is 22.29 kg/(m^2). GEN: Well nourished, well developed, in no acute distress HEENT: normal Neck: no JVD, carotid bruits, or masses Cardiac: RRR; no murmurs, rubs, or gallops,no edema  Respiratory:  clear to auscultation bilaterally, normal work of breathing GI: soft, nontender, nondistended, + BS MS: no deformity or atrophy Skin: warm and dry, no rash Neuro:  Strength and sensation are intact Psych: euthymic mood, full affect   EKG:  EKG is  ordered today. The ekg ordered today demonstrates sinus bradycardia with left axis deviation on 06/29/2014   Recent Labs: 08/06/2013: TSH 1.520 06/29/2014: ALT 15; BUN 13; Creat 0.96; Hemoglobin 15.9; Potassium 4.6; Sodium 136    Lipid Panel No results found for: CHOL, TRIG, HDL, CHOLHDL, VLDL, LDLCALC, LDLDIRECT    Wt Readings from Last 3 Encounters:  07/01/14 68.493 kg (151 lb)  06/29/14 68.04 kg (150 lb)  06/17/14 68.221 kg (150 lb 6.4 oz)      Other studies Reviewed: Additional studies/ records that were reviewed today included   ASSESSMENT AND   1. Chest pain, unspecified chest pain type Possibly angina pectoris. Start aspirin 81 mg  2. Neuromuscular disorder Prior history of polymyalgia rheumatica  3. Prostate cancer Asymptomatic  4. Clotting disorder Poorly described and evaluated but noticed bruising on prednisone. No formal diagnosis is ever been made.   Current medicines are reviewed at length with the patient today.  The patient does not have concerns regarding medicines.  The following changes have been made: Start aspirin, coated, 81 mg per day.  Labs/ tests ordered today include:   Orders Placed This Encounter  Procedures  . Myocardial Perfusion Imaging     Disposition:   FU with HS as needed depending on findings.  Signed, Sinclair Grooms, MD  07/01/2014 9:07 AM    [facility] [place], [place], [place]  [address] Phone: [phone]; Fax: [phone]

## 2014-07-01 ENCOUNTER — Encounter: Payer: Self-pay | Admitting: Interventional Cardiology

## 2014-07-01 ENCOUNTER — Ambulatory Visit (INDEPENDENT_AMBULATORY_CARE_PROVIDER_SITE_OTHER): Payer: Medicare Other | Admitting: Interventional Cardiology

## 2014-07-01 VITALS — BP 92/52 | HR 52 | Ht 69.0 in | Wt 151.0 lb

## 2014-07-01 DIAGNOSIS — C61 Malignant neoplasm of prostate: Secondary | ICD-10-CM

## 2014-07-01 DIAGNOSIS — R079 Chest pain, unspecified: Secondary | ICD-10-CM

## 2014-07-01 DIAGNOSIS — D689 Coagulation defect, unspecified: Secondary | ICD-10-CM

## 2014-07-01 DIAGNOSIS — G709 Myoneural disorder, unspecified: Secondary | ICD-10-CM

## 2014-07-01 MED ORDER — ASPIRIN EC 81 MG PO TBEC: 81.0000 mg | DELAYED_RELEASE_TABLET | Freq: Every day | ORAL | Status: DC

## 2014-07-01 NOTE — Patient Instructions (Addendum)
Medication Instructions:  START Aspriin 81mg  daily   Labwork: None   Testing/Procedures: Your physician has requested that you have en exercise stress myoview. For further information please visit [email or web address]. Please follow instruction sheet, as given.   Follow-Up: Your physician recommends that you schedule a follow-up appointment pending test results    Any Other Special Instructions Will Be Listed Below (If Applicable).

## 2014-07-08 ENCOUNTER — Telehealth: Payer: Self-pay

## 2014-07-08 NOTE — Telephone Encounter (Signed)
I spoke to the patient about the CREAD study. Patient is interested in the study and would like to find out more information. I will mail the Informed Consent Form (ICF).

## 2014-07-08 NOTE — Telephone Encounter (Signed)
I spoke to the patient's caregiver, Aarit Kashuba, about the CREAD study for Alzheimer's Disease (AD). Patient will return my call.

## 2014-07-10 ENCOUNTER — Telehealth (HOSPITAL_COMMUNITY): Payer: Self-pay | Admitting: *Deleted

## 2014-07-10 NOTE — Telephone Encounter (Signed)
Patient given detailed instructions per Myocardial Perfusion Study Information Sheet for test on 07/15/14 at 1145. Patient Notified to arrive 15 minutes early, and that it is imperative to arrive on time for appointment to keep from having the test rescheduled. Patient verbalized understanding. Hubbard Robinson, RN

## 2014-07-15 ENCOUNTER — Ambulatory Visit (HOSPITAL_COMMUNITY): Payer: Medicare Other | Attending: Cardiology

## 2014-07-15 DIAGNOSIS — R079 Chest pain, unspecified: Secondary | ICD-10-CM | POA: Insufficient documentation

## 2014-07-15 LAB — MYOCARDIAL PERFUSION IMAGING
CHL CUP MPHR: 151 {beats}/min
CHL CUP RESTING HR STRESS: 43 {beats}/min
CSEPEDS: 59 s
CSEPHR: 92 %
Estimated workload: 10.1 METS
Exercise duration (min): 8 min
LV dias vol: 113 mL
LV sys vol: 47 mL
Peak HR: 139 {beats}/min
RATE: 0.24
RPE: 18
SDS: 0
SRS: 0
SSS: 0
TID: 0.94

## 2014-07-15 MED ORDER — TECHNETIUM TC 99M SESTAMIBI GENERIC - CARDIOLITE
10.5000 | Freq: Once | INTRAVENOUS | Status: AC | PRN
  Administered 2014-07-15: 11 via INTRAVENOUS

## 2014-07-15 MED ORDER — TECHNETIUM TC 99M SESTAMIBI GENERIC - CARDIOLITE
31.5000 | Freq: Once | INTRAVENOUS | Status: AC | PRN
  Administered 2014-07-15: 32 via INTRAVENOUS

## 2014-07-17 ENCOUNTER — Telehealth: Payer: Self-pay

## 2014-07-17 NOTE — Telephone Encounter (Signed)
Pt aware of myoview results. Normal study. No suggestion of blocked arteries. Pt rqst a call form Dr.Smith to discuss results further. Adv pt that Dr.Smith will be out of the office for the next 2 weeks. I will fwd a message to call him when he returns. Pt verbalized understanding.

## 2014-07-17 NOTE — Telephone Encounter (Signed)
-----   Message from Belva Crome, MD sent at 07/15/2014  6:41 PM EDT ----- Normal study. No suggestion of blocked arteries.

## 2014-07-24 ENCOUNTER — Other Ambulatory Visit: Payer: Self-pay

## 2014-07-24 MED ORDER — DONEPEZIL HCL 10 MG PO TABS: 10.0000 mg | ORAL_TABLET | Freq: Every day | ORAL | Status: DC

## 2014-07-24 NOTE — Telephone Encounter (Signed)
Last OV note says: Begin Aricept (donepezil) at 5 mg at night for one month. If this medication is well-tolerated, please call our office and we will call in a prescription for the 10 mg tab  Patient has requested to increase to 10mg 

## 2014-07-27 NOTE — Telephone Encounter (Signed)
Spoke to patient

## 2014-08-12 ENCOUNTER — Ambulatory Visit: Payer: 59 | Admitting: Neurology

## 2014-10-08 ENCOUNTER — Telehealth: Payer: Self-pay | Admitting: Neurology

## 2014-10-08 NOTE — Telephone Encounter (Signed)
Pt called stating he was taking Exelon 10mg  starting in mid February. Then on 06/22/14 he started taking donepezil (ARICEPT) 10 MG tablet 5mg   for about a couple of weeks then went 10mg  thru the month of July. Then stopped taking it until 09/09/14. He restarted it 09/09/14 at 5mg  x 3 weeks then last Monday (09/29/14) he started 10mg . Then Wednesday he bent down to pick up newspaper from floor of car and when he came up he became dizzy. Since then he's been dizzy, headache, and nauseated. He has taken dramamine (prn) which has helped a little bit but by the end of the day he has a headache and takes Tylenol (prn). Please call and advise. Patient can be reached at [phone] and [phone].

## 2014-10-08 NOTE — Telephone Encounter (Signed)
I called patient. Shortly after going up to 10 mg of Aricept, the patient began having some troubles with dizziness, queasiness, headache in the evening, and muscle cramps in the legs. This may be related to the Aricept, he is to drop back to the 5 mg dosing, if he does not get better in the next week or so, he is to contact our office.

## 2014-10-08 NOTE — Telephone Encounter (Signed)
I called the patient. He states he did not stop Aricept due to dizziness the first time. He does not remember feeling dizzy while taking it at that time. He did take Aricept this morning and felt nauseous after taking it. He wanted to make sure Dr. Jannifer Franklin knew he was taking dramamine and tylenol to help with the side effects he is experiencing. He wondered if he should continue the Aricept.

## 2014-10-21 ENCOUNTER — Telehealth: Payer: Self-pay

## 2014-10-21 NOTE — Telephone Encounter (Signed)
I spoke to the patient in regards the CREAD study. Patient stated that he had stopped Aricept on 17OCT2016 due to the side effects that he has been experiencing. I told him that I would talk to the study team to see when the patient qualifies for the FCSRT examination. Patient voiced understanding.

## 2014-10-24 ENCOUNTER — Telehealth: Payer: Self-pay

## 2014-10-24 NOTE — Telephone Encounter (Signed)
I spoke to the patient in regards to the CREAD study. I told the patient that at this moment he does not qualify for the study, because he stopped taking Aricept on 17OCT2016 due to the side effects he has been experiencing. Per study requirements, the patient must be on stable dose of alzheimer's medication, and if stopped an AD medication, patient must remain so for 3 months. Therefore, the patient will be eligible to participate in [id number]. I conveyed all of this information to the patient, and he voiced understanding, and I will call him back in 3 months to see if he is still interested in participating in the trial.

## 2014-12-25 ENCOUNTER — Ambulatory Visit: Payer: Medicare Other | Admitting: Neurology

## 2015-01-23 DIAGNOSIS — Z125 Encounter for screening for malignant neoplasm of prostate: Secondary | ICD-10-CM | POA: Diagnosis not present

## 2015-01-27 ENCOUNTER — Telehealth: Payer: Self-pay

## 2015-01-27 NOTE — Telephone Encounter (Signed)
I spoke to the patient in regards to the CREAD study. The patient is no longer interested in participating. I thanked him for his time and consideration.

## 2015-01-30 DIAGNOSIS — K30 Functional dyspepsia: Secondary | ICD-10-CM | POA: Diagnosis not present

## 2015-01-30 DIAGNOSIS — Z6822 Body mass index (BMI) 22.0-22.9, adult: Secondary | ICD-10-CM | POA: Diagnosis not present

## 2015-01-30 DIAGNOSIS — Z0000 Encounter for general adult medical examination without abnormal findings: Secondary | ICD-10-CM | POA: Diagnosis not present

## 2015-01-30 DIAGNOSIS — Z1389 Encounter for screening for other disorder: Secondary | ICD-10-CM | POA: Diagnosis not present

## 2015-01-30 DIAGNOSIS — R413 Other amnesia: Secondary | ICD-10-CM | POA: Diagnosis not present

## 2015-01-30 DIAGNOSIS — M545 Low back pain: Secondary | ICD-10-CM | POA: Diagnosis not present

## 2015-01-30 DIAGNOSIS — D692 Other nonthrombocytopenic purpura: Secondary | ICD-10-CM | POA: Diagnosis not present

## 2015-01-30 DIAGNOSIS — M353 Polymyalgia rheumatica: Secondary | ICD-10-CM | POA: Diagnosis not present

## 2015-01-30 DIAGNOSIS — K5900 Constipation, unspecified: Secondary | ICD-10-CM | POA: Diagnosis not present

## 2015-01-30 DIAGNOSIS — R0789 Other chest pain: Secondary | ICD-10-CM | POA: Diagnosis not present

## 2015-01-30 DIAGNOSIS — M2550 Pain in unspecified joint: Secondary | ICD-10-CM | POA: Diagnosis not present

## 2015-01-30 DIAGNOSIS — C61 Malignant neoplasm of prostate: Secondary | ICD-10-CM | POA: Diagnosis not present

## 2015-02-04 DIAGNOSIS — Z1212 Encounter for screening for malignant neoplasm of rectum: Secondary | ICD-10-CM | POA: Diagnosis not present

## 2015-04-23 ENCOUNTER — Other Ambulatory Visit: Payer: Self-pay | Admitting: Gastroenterology

## 2015-05-20 ENCOUNTER — Encounter (HOSPITAL_COMMUNITY): Payer: Self-pay | Admitting: *Deleted

## 2015-06-02 ENCOUNTER — Encounter (HOSPITAL_COMMUNITY)
Admission: RE | Disposition: A | Discharge status: Home / Self Care | Payer: Self-pay | Source: Ambulatory Visit | Attending: Gastroenterology

## 2015-06-02 ENCOUNTER — Ambulatory Visit (HOSPITAL_COMMUNITY): Payer: PPO | Admitting: Anesthesiology

## 2015-06-02 ENCOUNTER — Ambulatory Visit (HOSPITAL_COMMUNITY)
Admission: RE | Admit: 2015-06-02 | Discharge: 2015-06-02 | Disposition: A | Discharge status: Home / Self Care | Payer: PPO | Source: Ambulatory Visit | Attending: Gastroenterology | Admitting: Gastroenterology

## 2015-06-02 ENCOUNTER — Encounter (HOSPITAL_COMMUNITY): Payer: Self-pay | Admitting: Anesthesiology

## 2015-06-02 DIAGNOSIS — Z1211 Encounter for screening for malignant neoplasm of colon: Secondary | ICD-10-CM | POA: Diagnosis not present

## 2015-06-02 DIAGNOSIS — Z86010 Personal history of colonic polyps: Secondary | ICD-10-CM | POA: Diagnosis not present

## 2015-06-02 DIAGNOSIS — Z8546 Personal history of malignant neoplasm of prostate: Secondary | ICD-10-CM | POA: Insufficient documentation

## 2015-06-02 DIAGNOSIS — Z79899 Other long term (current) drug therapy: Secondary | ICD-10-CM | POA: Insufficient documentation

## 2015-06-02 DIAGNOSIS — Z9079 Acquired absence of other genital organ(s): Secondary | ICD-10-CM | POA: Diagnosis not present

## 2015-06-02 DIAGNOSIS — Z9049 Acquired absence of other specified parts of digestive tract: Secondary | ICD-10-CM | POA: Diagnosis not present

## 2015-06-02 HISTORY — DX: Nonrheumatic mitral (valve) prolapse: I34.1

## 2015-06-02 HISTORY — PX: COLONOSCOPY WITH PROPOFOL: SHX5780

## 2015-06-02 SURGERY — COLONOSCOPY WITH PROPOFOL
Anesthesia: Monitor Anesthesia Care

## 2015-06-02 MED ORDER — PROPOFOL 10 MG/ML IV BOLUS
INTRAVENOUS | Status: AC
  Filled 2015-06-02: qty 40

## 2015-06-02 MED ORDER — SODIUM CHLORIDE 0.9 % IV SOLN: INTRAVENOUS | Status: DC

## 2015-06-02 MED ORDER — PROPOFOL 10 MG/ML IV BOLUS
INTRAVENOUS | Status: DC | PRN
  Administered 2015-06-02 (×2): 20 mg via INTRAVENOUS
  Administered 2015-06-02 (×2): 50 mg via INTRAVENOUS
  Administered 2015-06-02: 20 mg via INTRAVENOUS
  Administered 2015-06-02: 50 mg via INTRAVENOUS

## 2015-06-02 MED ORDER — LACTATED RINGERS IV SOLN
INTRAVENOUS | Status: DC
  Administered 2015-06-02 (×2): via INTRAVENOUS

## 2015-06-02 SURGICAL SUPPLY — 21 items

## 2015-06-02 NOTE — Discharge Instructions (Signed)
Colonoscopy, Care After °Refer to this sheet in the next few weeks. These instructions provide you with information on caring for yourself after your procedure. Your health care provider may also give you more specific instructions. Your treatment has been planned according to current medical practices, but problems sometimes occur. Call your health care provider if you have any problems or questions after your procedure. °WHAT TO EXPECT AFTER THE PROCEDURE  °After your procedure, it is typical to have the following: °· A small amount of blood in your stool. °· Moderate amounts of gas and mild abdominal cramping or bloating. °HOME CARE INSTRUCTIONS °· Do not drive, operate machinery, or sign important documents for 24 hours. °· You may shower and resume your regular physical activities, but move at a slower pace for the first 24 hours. °· Take frequent rest periods for the first 24 hours. °· Walk around or put a warm pack on your abdomen to help reduce abdominal cramping and bloating. °· Drink enough fluids to keep your urine clear or pale yellow. °· You may resume your normal diet as instructed by your health care provider. Avoid heavy or fried foods that are hard to digest. °· Avoid drinking alcohol for 24 hours or as instructed by your health care provider. °· Only take over-the-counter or prescription medicines as directed by your health care provider. °· If a tissue sample (biopsy) was taken during your procedure: °¨ Do not take aspirin or blood thinners for 7 days, or as instructed by your health care provider. °¨ Do not drink alcohol for 7 days, or as instructed by your health care provider. °¨ Eat soft foods for the first 24 hours. °SEEK MEDICAL CARE IF: °You have persistent spotting of blood in your stool 2-3 days after the procedure. °SEEK IMMEDIATE MEDICAL CARE IF: °· You have more than a small spotting of blood in your stool. °· You pass large blood clots in your stool. °· Your abdomen is swollen  (distended). °· You have nausea or vomiting. °· You have a fever. °· You have increasing abdominal pain that is not relieved with medicine. °  °This information is not intended to replace advice given to you by your health care provider. Make sure you discuss any questions you have with your health care provider. °  °Document Released: 08/04/2003 Document Revised: 10/10/2012 Document Reviewed: 08/27/2012 °Elsevier Interactive Patient Education ©2016 [employer] ° °

## 2015-06-02 NOTE — Op Note (Signed)
[facility] Patient Name: Jay Ayers Procedure Date: 06/02/2015 MRN: [id number] Attending MD: Garlan Fair , MD Date of Birth: April 13, 1945 CSN: [id number] Age: 70 Admit Type: Outpatient Procedure:                Colonoscopy Indications:              High risk colon cancer surveillance: 04/06/2010                            colonoscopy with removal of a 5 mm ascending colon                            adenomatous polyp was performed Providers:                Garlan Fair, MD, Laverta Baltimore, RN, Alfonso Patten, Technician, Glenis Smoker, CRNA Referring MD:              Medicines:                Propofol per Anesthesia Complications:            No immediate complications. Estimated Blood Loss:     Estimated blood loss: none. Procedure:                Pre-Anesthesia Assessment:                           - Prior to the procedure, a History and Physical                            was performed, and patient medications and                            allergies were reviewed. The patient's tolerance of                            previous anesthesia was also reviewed. The risks                            and benefits of the procedure and the sedation                            options and risks were discussed with the patient.                            All questions were answered, and informed consent                            was obtained. Prior Anticoagulants: The patient has                            taken no previous anticoagulant or antiplatelet  agents. ASA Grade Assessment: II - A patient with                            mild systemic disease. After reviewing the risks                            and benefits, the patient was deemed in                            satisfactory condition to undergo the procedure.                           After obtaining informed consent, the colonoscope                             was passed under direct vision. Throughout the                            procedure, the patient's blood pressure, pulse, and                            oxygen saturations were monitored continuously. The                            [id number] scope was introduced through                            the anus and advanced to the the cecum, identified                            by appendiceal orifice and ileocecal valve. The                            colonoscopy was somewhat difficult due to                            significant looping. The patient tolerated the                            procedure well. The quality of the bowel                            preparation was good. The terminal ileum, the                            ileocecal valve, the appendiceal orifice and the                            rectum were photographed. Scope In: 9:10:58 AM Scope Out: 9:34:58 AM Scope Withdrawal Time: 0 hours 10 minutes 25 seconds  Total Procedure Duration: 0 hours 24 minutes 0 seconds  Findings:      The perianal and digital rectal examinations were normal.      The entire examined colon appeared normal. Impression:               -  The entire examined colon is normal.                           - No specimens collected. Moderate Sedation:      N/A- Per Anesthesia Care Recommendation:           - Patient has a contact number available for                            emergencies. The signs and symptoms of potential                            delayed complications were discussed with the                            patient. Return to normal activities tomorrow.                            Written discharge instructions were provided to the                            patient.                           - Repeat colonoscopy is not recommended for                            surveillance.                           - Resume previous diet.                           - Continue present  medications. Procedure Code(s):        --- Professional ---                           [phone], Colonoscopy, flexible; diagnostic, including                            collection of specimen(s) by brushing or washing,                            when performed (separate procedure) Diagnosis Code(s):        --- Professional ---                           Z86.010, Personal history of colonic polyps CPT copyright 2016 [employer]. All rights reserved. The codes documented in this report are preliminary and upon coder review may  be revised to meet current compliance requirements. [name], MD Garlan Fair, MD 06/02/2015 9:40:55 AM This report has been signed electronically. Number of Addenda: 0

## 2015-06-02 NOTE — H&P (Signed)
  Procedure: Surveillance colonoscopy. 04/06/2010 colonoscopy with removal of a 5 mm ascending colon adenomatous polyp was performed  History: The patient is a 70 year old male born 1945-10-06. He is scheduled to undergo a surveillance colonoscopy today.  Medication allergies: None  Past medical history: Prostatectomy to treat prostate cancer. Appendectomy. Tonsillectomy. Pilonidal cystectomy. Rotator cuff repair. Lumbar back surgery.  Exam: The patient is alert and lying comfortably on the endoscopy stretcher. Abdomen is soft and nontender to palpation. Lungs are clear to auscultation. Cardiac exam reveals a regular rhythm.  Plan: Proceed with surveillance colonoscopy

## 2015-06-02 NOTE — Anesthesia Preprocedure Evaluation (Addendum)
Anesthesia Evaluation  Patient identified by MRN, date of birth, ID band Patient awake    Reviewed: Allergy & Precautions, NPO status , Patient's Chart, lab work & pertinent test results  Airway Mallampati: II  TM Distance: >3 FB Neck ROM: Full    Dental no notable dental hx. (+) Dental Advisory Given   Pulmonary neg pulmonary ROS,    Pulmonary exam normal breath sounds clear to auscultation       Cardiovascular negative cardio ROS Normal cardiovascular exam Rhythm:Regular Rate:Normal     Neuro/Psych  Neuromuscular disease negative psych ROS   GI/Hepatic Neg liver ROS, GERD  ,  Endo/Other  negative endocrine ROS  Renal/GU negative Renal ROS  negative genitourinary   Musculoskeletal  (+) Arthritis ,   Abdominal   Peds negative pediatric ROS (+)  Hematology negative hematology ROS (+)   Anesthesia Other Findings   Reproductive/Obstetrics negative OB ROS                            Anesthesia Physical Anesthesia Plan  ASA: II  Anesthesia Plan: MAC   Post-op Pain Management:    Induction: Intravenous  Airway Management Planned: Natural Airway  Additional Equipment:   Intra-op Plan:   Post-operative Plan:   Informed Consent: I have reviewed the patients History and Physical, chart, labs and discussed the procedure including the risks, benefits and alternatives for the proposed anesthesia with the patient or authorized representative who has indicated his/her understanding and acceptance.   Dental advisory given  Plan Discussed with: CRNA  Anesthesia Plan Comments:         Anesthesia Quick Evaluation

## 2015-06-02 NOTE — Transfer of Care (Signed)
Immediate Anesthesia Transfer of Care Note  Patient: Jay Ayers  Procedure(s) Performed: Procedure(s): COLONOSCOPY WITH PROPOFOL (N/A)  Patient Location: PACU and Endoscopy Unit  Anesthesia Type:MAC  Level of Consciousness: awake and alert   Airway & Oxygen Therapy: Patient Spontanous Breathing and Patient connected to face mask oxygen  Post-op Assessment: Report given to RN and Post -op Vital signs reviewed and stable  Post vital signs: Reviewed and stable  Last Vitals:  Filed Vitals:   06/02/15 0838  BP: 152/73  Pulse: 56  Temp: 36.6 C  Resp: 13    Last Pain: There were no vitals filed for this visit.    Patients Stated Pain Goal: 7 ([id number])  Complications: No apparent anesthesia complications

## 2015-06-02 NOTE — Anesthesia Postprocedure Evaluation (Signed)
Anesthesia Post Note  Patient: Jay Ayers  Procedure(s) Performed: Procedure(s) (LRB): COLONOSCOPY WITH PROPOFOL (N/A)  Patient location during evaluation: PACU Anesthesia Type: MAC Level of consciousness: awake and alert Pain management: pain level controlled Vital Signs Assessment: post-procedure vital signs reviewed and stable Respiratory status: spontaneous breathing, nonlabored ventilation, respiratory function stable and patient connected to nasal cannula oxygen Cardiovascular status: stable and blood pressure returned to baseline Anesthetic complications: no    Last Vitals:  Filed Vitals:   06/02/15 1000 06/02/15 1010  BP: 128/72 134/67  Pulse: 54 48  Temp:    Resp: 17 17    Last Pain: There were no vitals filed for this visit.               Parilee Hally J

## 2015-06-03 ENCOUNTER — Encounter (HOSPITAL_COMMUNITY): Payer: Self-pay | Admitting: Gastroenterology

## 2015-08-20 DIAGNOSIS — H353121 Nonexudative age-related macular degeneration, left eye, early dry stage: Secondary | ICD-10-CM | POA: Diagnosis not present

## 2015-08-20 DIAGNOSIS — H10413 Chronic giant papillary conjunctivitis, bilateral: Secondary | ICD-10-CM | POA: Diagnosis not present

## 2015-08-20 DIAGNOSIS — H2513 Age-related nuclear cataract, bilateral: Secondary | ICD-10-CM | POA: Diagnosis not present

## 2015-08-20 DIAGNOSIS — H04123 Dry eye syndrome of bilateral lacrimal glands: Secondary | ICD-10-CM | POA: Diagnosis not present

## 2015-10-17 DIAGNOSIS — Z23 Encounter for immunization: Secondary | ICD-10-CM | POA: Diagnosis not present

## 2015-10-27 DIAGNOSIS — M79642 Pain in left hand: Secondary | ICD-10-CM | POA: Diagnosis not present

## 2015-10-27 DIAGNOSIS — M25562 Pain in left knee: Secondary | ICD-10-CM | POA: Diagnosis not present

## 2015-10-27 DIAGNOSIS — M545 Low back pain: Secondary | ICD-10-CM | POA: Diagnosis not present

## 2015-10-27 DIAGNOSIS — M25561 Pain in right knee: Secondary | ICD-10-CM | POA: Diagnosis not present

## 2015-10-27 DIAGNOSIS — M722 Plantar fascial fibromatosis: Secondary | ICD-10-CM | POA: Diagnosis not present

## 2015-10-27 DIAGNOSIS — M79641 Pain in right hand: Secondary | ICD-10-CM | POA: Diagnosis not present

## 2015-10-27 DIAGNOSIS — M2550 Pain in unspecified joint: Secondary | ICD-10-CM | POA: Diagnosis not present

## 2015-11-18 DIAGNOSIS — M25561 Pain in right knee: Secondary | ICD-10-CM | POA: Diagnosis not present

## 2015-11-18 DIAGNOSIS — G8929 Other chronic pain: Secondary | ICD-10-CM | POA: Diagnosis not present

## 2015-11-18 DIAGNOSIS — M25562 Pain in left knee: Secondary | ICD-10-CM | POA: Diagnosis not present

## 2015-11-19 DIAGNOSIS — M2550 Pain in unspecified joint: Secondary | ICD-10-CM | POA: Diagnosis not present

## 2015-11-19 DIAGNOSIS — M25569 Pain in unspecified knee: Secondary | ICD-10-CM | POA: Diagnosis not present

## 2015-12-04 DIAGNOSIS — G8929 Other chronic pain: Secondary | ICD-10-CM | POA: Diagnosis not present

## 2015-12-04 DIAGNOSIS — M25561 Pain in right knee: Secondary | ICD-10-CM | POA: Diagnosis not present

## 2015-12-04 DIAGNOSIS — M25562 Pain in left knee: Secondary | ICD-10-CM | POA: Diagnosis not present

## 2015-12-04 DIAGNOSIS — M11262 Other chondrocalcinosis, left knee: Secondary | ICD-10-CM | POA: Diagnosis not present

## 2016-01-11 DIAGNOSIS — M5416 Radiculopathy, lumbar region: Secondary | ICD-10-CM | POA: Diagnosis not present

## 2016-01-11 DIAGNOSIS — M5136 Other intervertebral disc degeneration, lumbar region: Secondary | ICD-10-CM | POA: Diagnosis not present

## 2016-01-11 DIAGNOSIS — M4726 Other spondylosis with radiculopathy, lumbar region: Secondary | ICD-10-CM | POA: Diagnosis not present

## 2016-01-11 DIAGNOSIS — M48062 Spinal stenosis, lumbar region with neurogenic claudication: Secondary | ICD-10-CM | POA: Diagnosis not present

## 2016-01-11 DIAGNOSIS — M546 Pain in thoracic spine: Secondary | ICD-10-CM | POA: Diagnosis not present

## 2016-01-21 DIAGNOSIS — M4726 Other spondylosis with radiculopathy, lumbar region: Secondary | ICD-10-CM | POA: Diagnosis not present

## 2016-01-21 DIAGNOSIS — M5136 Other intervertebral disc degeneration, lumbar region: Secondary | ICD-10-CM | POA: Diagnosis not present

## 2016-01-21 DIAGNOSIS — M48061 Spinal stenosis, lumbar region without neurogenic claudication: Secondary | ICD-10-CM | POA: Diagnosis not present

## 2016-01-26 DIAGNOSIS — R8299 Other abnormal findings in urine: Secondary | ICD-10-CM | POA: Diagnosis not present

## 2016-01-26 DIAGNOSIS — Z125 Encounter for screening for malignant neoplasm of prostate: Secondary | ICD-10-CM | POA: Diagnosis not present

## 2016-01-26 DIAGNOSIS — R0789 Other chest pain: Secondary | ICD-10-CM | POA: Diagnosis not present

## 2016-01-26 DIAGNOSIS — I951 Orthostatic hypotension: Secondary | ICD-10-CM | POA: Diagnosis not present

## 2016-01-26 DIAGNOSIS — Z0000 Encounter for general adult medical examination without abnormal findings: Secondary | ICD-10-CM | POA: Diagnosis not present

## 2016-02-02 DIAGNOSIS — C61 Malignant neoplasm of prostate: Secondary | ICD-10-CM | POA: Diagnosis not present

## 2016-02-02 DIAGNOSIS — Z1389 Encounter for screening for other disorder: Secondary | ICD-10-CM | POA: Diagnosis not present

## 2016-02-02 DIAGNOSIS — I951 Orthostatic hypotension: Secondary | ICD-10-CM | POA: Diagnosis not present

## 2016-02-02 DIAGNOSIS — Z0000 Encounter for general adult medical examination without abnormal findings: Secondary | ICD-10-CM | POA: Diagnosis not present

## 2016-02-02 DIAGNOSIS — K5909 Other constipation: Secondary | ICD-10-CM | POA: Diagnosis not present

## 2016-02-02 DIAGNOSIS — M25569 Pain in unspecified knee: Secondary | ICD-10-CM | POA: Diagnosis not present

## 2016-02-02 DIAGNOSIS — F5221 Male erectile disorder: Secondary | ICD-10-CM | POA: Diagnosis not present

## 2016-02-02 DIAGNOSIS — D698 Other specified hemorrhagic conditions: Secondary | ICD-10-CM | POA: Diagnosis not present

## 2016-02-02 DIAGNOSIS — K30 Functional dyspepsia: Secondary | ICD-10-CM | POA: Diagnosis not present

## 2016-02-02 DIAGNOSIS — N401 Enlarged prostate with lower urinary tract symptoms: Secondary | ICD-10-CM | POA: Diagnosis not present

## 2016-02-02 DIAGNOSIS — R413 Other amnesia: Secondary | ICD-10-CM | POA: Diagnosis not present

## 2016-02-02 DIAGNOSIS — M6281 Muscle weakness (generalized): Secondary | ICD-10-CM | POA: Diagnosis not present

## 2016-02-15 DIAGNOSIS — Z1212 Encounter for screening for malignant neoplasm of rectum: Secondary | ICD-10-CM | POA: Diagnosis not present

## 2016-03-11 DIAGNOSIS — M5136 Other intervertebral disc degeneration, lumbar region: Secondary | ICD-10-CM | POA: Diagnosis not present

## 2016-03-11 DIAGNOSIS — M48062 Spinal stenosis, lumbar region with neurogenic claudication: Secondary | ICD-10-CM | POA: Diagnosis not present

## 2016-03-11 DIAGNOSIS — M5126 Other intervertebral disc displacement, lumbar region: Secondary | ICD-10-CM | POA: Diagnosis not present

## 2016-03-11 DIAGNOSIS — M4726 Other spondylosis with radiculopathy, lumbar region: Secondary | ICD-10-CM | POA: Diagnosis not present

## 2016-06-16 DIAGNOSIS — Z85828 Personal history of other malignant neoplasm of skin: Secondary | ICD-10-CM | POA: Diagnosis not present

## 2016-06-16 DIAGNOSIS — C4442 Squamous cell carcinoma of skin of scalp and neck: Secondary | ICD-10-CM | POA: Diagnosis not present

## 2016-06-16 DIAGNOSIS — L814 Other melanin hyperpigmentation: Secondary | ICD-10-CM | POA: Diagnosis not present

## 2016-06-16 DIAGNOSIS — D2371 Other benign neoplasm of skin of right lower limb, including hip: Secondary | ICD-10-CM | POA: Diagnosis not present

## 2016-06-16 DIAGNOSIS — D1801 Hemangioma of skin and subcutaneous tissue: Secondary | ICD-10-CM | POA: Diagnosis not present

## 2016-06-16 DIAGNOSIS — L820 Inflamed seborrheic keratosis: Secondary | ICD-10-CM | POA: Diagnosis not present

## 2016-06-16 DIAGNOSIS — C4441 Basal cell carcinoma of skin of scalp and neck: Secondary | ICD-10-CM | POA: Diagnosis not present

## 2016-06-16 DIAGNOSIS — L821 Other seborrheic keratosis: Secondary | ICD-10-CM | POA: Diagnosis not present

## 2016-06-16 DIAGNOSIS — D225 Melanocytic nevi of trunk: Secondary | ICD-10-CM | POA: Diagnosis not present

## 2016-10-26 DIAGNOSIS — R5383 Other fatigue: Secondary | ICD-10-CM | POA: Diagnosis not present

## 2016-10-26 DIAGNOSIS — Z6823 Body mass index (BMI) 23.0-23.9, adult: Secondary | ICD-10-CM | POA: Diagnosis not present

## 2016-10-26 DIAGNOSIS — R51 Headache: Secondary | ICD-10-CM | POA: Diagnosis not present

## 2016-10-26 DIAGNOSIS — M542 Cervicalgia: Secondary | ICD-10-CM | POA: Diagnosis not present

## 2016-10-26 DIAGNOSIS — M2550 Pain in unspecified joint: Secondary | ICD-10-CM | POA: Diagnosis not present

## 2016-11-04 DIAGNOSIS — M542 Cervicalgia: Secondary | ICD-10-CM | POA: Diagnosis not present

## 2016-11-10 DIAGNOSIS — M542 Cervicalgia: Secondary | ICD-10-CM | POA: Diagnosis not present

## 2016-11-30 DIAGNOSIS — Z23 Encounter for immunization: Secondary | ICD-10-CM | POA: Diagnosis not present

## 2017-01-31 DIAGNOSIS — Z79899 Other long term (current) drug therapy: Secondary | ICD-10-CM | POA: Diagnosis not present

## 2017-01-31 DIAGNOSIS — Z125 Encounter for screening for malignant neoplasm of prostate: Secondary | ICD-10-CM | POA: Diagnosis not present

## 2017-01-31 DIAGNOSIS — R82998 Other abnormal findings in urine: Secondary | ICD-10-CM | POA: Diagnosis not present

## 2017-02-07 DIAGNOSIS — D692 Other nonthrombocytopenic purpura: Secondary | ICD-10-CM | POA: Diagnosis not present

## 2017-02-07 DIAGNOSIS — M2550 Pain in unspecified joint: Secondary | ICD-10-CM | POA: Diagnosis not present

## 2017-02-07 DIAGNOSIS — Z0000 Encounter for general adult medical examination without abnormal findings: Secondary | ICD-10-CM | POA: Diagnosis not present

## 2017-02-07 DIAGNOSIS — Z1389 Encounter for screening for other disorder: Secondary | ICD-10-CM | POA: Diagnosis not present

## 2017-02-07 DIAGNOSIS — K635 Polyp of colon: Secondary | ICD-10-CM | POA: Diagnosis not present

## 2017-02-07 DIAGNOSIS — Z6823 Body mass index (BMI) 23.0-23.9, adult: Secondary | ICD-10-CM | POA: Diagnosis not present

## 2017-02-07 DIAGNOSIS — K30 Functional dyspepsia: Secondary | ICD-10-CM | POA: Diagnosis not present

## 2017-02-07 DIAGNOSIS — F5221 Male erectile disorder: Secondary | ICD-10-CM | POA: Diagnosis not present

## 2017-02-07 DIAGNOSIS — C61 Malignant neoplasm of prostate: Secondary | ICD-10-CM | POA: Diagnosis not present

## 2017-02-07 DIAGNOSIS — R413 Other amnesia: Secondary | ICD-10-CM | POA: Diagnosis not present

## 2017-02-07 DIAGNOSIS — M353 Polymyalgia rheumatica: Secondary | ICD-10-CM | POA: Diagnosis not present

## 2017-02-07 DIAGNOSIS — N401 Enlarged prostate with lower urinary tract symptoms: Secondary | ICD-10-CM | POA: Diagnosis not present

## 2017-02-16 DIAGNOSIS — Z1212 Encounter for screening for malignant neoplasm of rectum: Secondary | ICD-10-CM | POA: Diagnosis not present

## 2017-06-26 ENCOUNTER — Emergency Department (HOSPITAL_BASED_OUTPATIENT_CLINIC_OR_DEPARTMENT_OTHER): Payer: PPO

## 2017-06-26 ENCOUNTER — Encounter (HOSPITAL_BASED_OUTPATIENT_CLINIC_OR_DEPARTMENT_OTHER): Payer: Self-pay

## 2017-06-26 ENCOUNTER — Ambulatory Visit (HOSPITAL_COMMUNITY)
Admission: EM | Admit: 2017-06-26 | Discharge: 2017-06-26 | Disposition: A | Discharge status: Home / Self Care | Payer: PPO | Source: Home / Self Care

## 2017-06-26 ENCOUNTER — Other Ambulatory Visit: Payer: Self-pay

## 2017-06-26 ENCOUNTER — Emergency Department (HOSPITAL_BASED_OUTPATIENT_CLINIC_OR_DEPARTMENT_OTHER)
Admission: EM | Admit: 2017-06-26 | Discharge: 2017-06-26 | Disposition: A | Discharge status: Home / Self Care | Payer: PPO | Attending: Emergency Medicine | Admitting: Emergency Medicine

## 2017-06-26 DIAGNOSIS — Y999 Unspecified external cause status: Secondary | ICD-10-CM | POA: Diagnosis not present

## 2017-06-26 DIAGNOSIS — T148XXA Other injury of unspecified body region, initial encounter: Secondary | ICD-10-CM | POA: Insufficient documentation

## 2017-06-26 DIAGNOSIS — S8012XA Contusion of left lower leg, initial encounter: Secondary | ICD-10-CM | POA: Insufficient documentation

## 2017-06-26 DIAGNOSIS — S20411A Abrasion of right back wall of thorax, initial encounter: Secondary | ICD-10-CM | POA: Insufficient documentation

## 2017-06-26 DIAGNOSIS — Z79899 Other long term (current) drug therapy: Secondary | ICD-10-CM | POA: Insufficient documentation

## 2017-06-26 DIAGNOSIS — S0101XA Laceration without foreign body of scalp, initial encounter: Secondary | ICD-10-CM | POA: Diagnosis not present

## 2017-06-26 DIAGNOSIS — W11XXXA Fall on and from ladder, initial encounter: Secondary | ICD-10-CM | POA: Diagnosis not present

## 2017-06-26 DIAGNOSIS — Y93H2 Activity, gardening and landscaping: Secondary | ICD-10-CM | POA: Diagnosis not present

## 2017-06-26 DIAGNOSIS — Y929 Unspecified place or not applicable: Secondary | ICD-10-CM | POA: Diagnosis not present

## 2017-06-26 DIAGNOSIS — S51812A Laceration without foreign body of left forearm, initial encounter: Secondary | ICD-10-CM | POA: Diagnosis not present

## 2017-06-26 DIAGNOSIS — S0990XA Unspecified injury of head, initial encounter: Secondary | ICD-10-CM | POA: Diagnosis not present

## 2017-06-26 DIAGNOSIS — Z23 Encounter for immunization: Secondary | ICD-10-CM | POA: Insufficient documentation

## 2017-06-26 DIAGNOSIS — S199XXA Unspecified injury of neck, initial encounter: Secondary | ICD-10-CM | POA: Diagnosis not present

## 2017-06-26 HISTORY — DX: Polymyalgia rheumatica: M35.3

## 2017-06-26 MED ORDER — TETANUS-DIPHTH-ACELL PERTUSSIS 5-2.5-18.5 LF-MCG/0.5 IM SUSP
0.5000 mL | Freq: Once | INTRAMUSCULAR | Status: AC
  Administered 2017-06-26: 0.5 mL via INTRAMUSCULAR
  Filled 2017-06-26: qty 0.5

## 2017-06-26 MED ORDER — CEPHALEXIN 500 MG PO CAPS: 500.0000 mg | ORAL_CAPSULE | Freq: Three times a day (TID) | ORAL | 0 refills | Status: AC

## 2017-06-26 MED ORDER — LIDOCAINE-EPINEPHRINE (PF) 2 %-1:200000 IJ SOLN
INTRAMUSCULAR | Status: AC
  Filled 2017-06-26: qty 10

## 2017-06-26 MED ORDER — LIDOCAINE HCL (PF) 1 % IJ SOLN
5.0000 mL | Freq: Once | INTRAMUSCULAR | Status: AC
  Administered 2017-06-26: 5 mL via INTRADERMAL
  Filled 2017-06-26: qty 5

## 2017-06-26 MED ORDER — ORPHENADRINE CITRATE ER 100 MG PO TB12: 100.0000 mg | ORAL_TABLET | Freq: Two times a day (BID) | ORAL | 0 refills | Status: AC

## 2017-06-26 MED ORDER — LIDOCAINE-EPINEPHRINE (PF) 2 %-1:200000 IJ SOLN
10.0000 mL | Freq: Once | INTRAMUSCULAR | Status: AC
  Administered 2017-06-26: 10 mL
  Filled 2017-06-26: qty 10

## 2017-06-26 NOTE — ED Notes (Signed)
Pt fell off latter 18 feet and struck head on ladder, felt dizzy, feels dizzy now, bloody wounds noted to head. Per [facility] pt needs evaluation at ER, given directs [facility].

## 2017-06-26 NOTE — ED Triage Notes (Addendum)
Pt fell approx 15-18 feet off ladder approx 130pm-pt with coban dsg to left UE right and head-sent from [facility]-pt NAD-steady gait-states his neck feels stiff-hard ccollar applied

## 2017-06-26 NOTE — ED Provider Notes (Signed)
[facility] EMERGENCY DEPARTMENT Provider Note   CSN: [id number] Arrival date & time: 06/26/17  1537     History   Chief Complaint Chief Complaint  Patient presents with  . Trauma    HPI Jay Ayers is a 71 y.o. male.  72 year old male presents with injuries from a fall.  Patient states he is approximately 18 feet up on a ladder trimming a tree branch when he fell backwards off of the ladder.  Patient states that he hit the back of his head on the ladder and fell landing on the dirt surface below him.  He denies loss of consciousness is not on blood thinners.  States that he bleeds easily from steroid use for PMR in the past.  Denies neck or back pain.  Reports several small wounds to his arms as well as a bruise to his left lower leg.  Denies any concern for pains in his arms or legs, denies difficulty walking, abdominal pain.  No other injuries, complaints, concerns.  Last tetanus is unknown.     Past Medical History:  Diagnosis Date  . Arthritis    knees, back , shoulders. hx. Polycythermia Rheumatica  . Clotting disorder ([place])    "tends to bleed easily"  . GERD (gastroesophageal reflux disease)   . Memory disturbance 08/06/2013   evaluated- memory appears to be worsening- no meds helped  . Mitral valve prolapse   . Neuromuscular disorder ([place])   . PMR (polymyalgia rheumatica) ([facility])   . Prostate cancer ([place])   . Weakness of back     Patient Active Problem List   Diagnosis Date Noted  . Chest pain 06/30/2014  . Neuromuscular disorder ([place]) 06/30/2014  . Prostate cancer ([place]) 06/30/2014  . Clotting disorder ([place]) 06/30/2014  . Memory disturbance 08/06/2013    Past Surgical History:  Procedure Laterality Date  . APPENDECTOMY  1958  . BACK SURGERY  1995  . COLONOSCOPY WITH PROPOFOL N/A 06/02/2015   Procedure: COLONOSCOPY WITH PROPOFOL;  Surgeon: Garlan Fair, MD;  Location: [facility];  Service: Endoscopy;  Laterality: N/A;  . EYE SURGERY     "cyst on eye excised"  . MINOR NAILBED REPAIR Right 04/25/2013   Procedure: RIGHT THUMB NAIL BIOPSY    (MINOR PROCEDURE);  Surgeon: Cammie Sickle., MD;  Location: [facility];  Service: Orthopedics;  Laterality: Right;  clean class  . PILONIDAL CYST EXCISION    . PROSTATE SURGERY  2005  . ROTATOR CUFF REPAIR Right 2002  . SPINE SURGERY     only epidural injection in back after lumbar surgery several yrs ago  . TONSILLECTOMY  1964  . TRANSURETHRAL RESECTION OF PROSTATE  1998  . VASECTOMY  1991        Home Medications    Prior to Admission medications   Medication Sig Start Date End Date Taking? Authorizing Provider  acetaminophen (TYLENOL) 500 MG tablet Take 500 mg by mouth every 6 (six) hours as needed (For pain.).    [provider]  cephALEXin (KEFLEX) 500 MG capsule Take 1 capsule (500 mg total) by mouth 3 (three) times daily for 7 days. 06/26/17 07/03/17  Tacy Learn, PA-C  Multiple Vitamin (MULTIVITAMIN) tablet Take 1 tablet by mouth daily.    [provider]  orphenadrine (NORFLEX) 100 MG tablet Take 1 tablet (100 mg total) by mouth 2 (two) times daily for 5 days. 06/26/17 07/01/17  Tacy Learn, PA-C    Family History Family  History  Problem Relation Age of Onset  . Stroke Mother   . Heart failure Mother   . Emphysema Father   . Multiple sclerosis Brother   . Heart Problems Brother   . Prostate cancer Brother   . Leukemia Brother   . Multiple sclerosis Unknown   . Prostate cancer Unknown   . Heart Problems Unknown     Social History Social History   Tobacco Use  . Smoking status: Never Smoker  . Smokeless tobacco: Never Used  Substance Use Topics  . Alcohol use: Yes    Comment: occ  . Drug use: No     Allergies   Nitrofurantoin   Review of Systems Review of Systems  Constitutional: Negative for fever.  Eyes: Negative for visual disturbance.  Respiratory: Negative for shortness of breath.   Cardiovascular:  Negative for chest pain.  Gastrointestinal: Negative for abdominal pain, nausea and vomiting.  Musculoskeletal: Positive for myalgias. Negative for back pain, gait problem, neck pain and neck stiffness.  Skin: Positive for wound.  Allergic/Immunologic: Negative for immunocompromised state.  Neurological: Negative for dizziness, weakness and headaches.  Hematological: Bruises/bleeds easily.  Psychiatric/Behavioral: Negative for agitation and confusion.  All other systems reviewed and are negative.    Physical Exam Updated Vital Signs BP 139/66   Pulse (!) 59   Temp 97.6 F (36.4 C) (Oral)   Resp 18   Wt 69.8 kg (153 lb 14.1 oz)   SpO2 99%   BMI 22.72 kg/m   Physical Exam  Constitutional: He is oriented to person, place, and time. He appears well-developed and well-nourished.    HENT:  Right Ear: External ear normal.  Left Ear: External ear normal.  Nose: Nose normal.  Mouth/Throat: Oropharynx is clear and moist. No oropharyngeal exudate.  Eyes: Pupils are equal, round, and reactive to light. Conjunctivae and EOM are normal.  Cardiovascular: Normal rate, regular rhythm, normal heart sounds and intact distal pulses.  No murmur heard. Pulmonary/Chest: Effort normal and breath sounds normal. No respiratory distress. He exhibits no tenderness.  Abdominal: Soft. He exhibits no distension. There is no tenderness.  Musculoskeletal: He exhibits no tenderness or deformity.       Back:       Arms:      Legs: Laceration to left forearm, skin tears to bilateral arms not requiring closure. Hematoma left lower leg posteriorly.   Neurological: He is alert and oriented to person, place, and time. No cranial nerve deficit or sensory deficit.  Skin: Skin is warm and dry. No rash noted.  Psychiatric: He has a normal mood and affect. His behavior is normal.  Nursing note and vitals reviewed.    ED Treatments / Results  Labs (all labs ordered are listed, but only abnormal results are  displayed) Labs Reviewed - No data to display  EKG None  Radiology Ct Head Wo Contrast  Result Date: 06/26/2017 CLINICAL DATA:  Fall from ladder with head injury. Occipital laceration. EXAM: CT HEAD WITHOUT CONTRAST CT CERVICAL SPINE WITHOUT CONTRAST TECHNIQUE: Multidetector CT imaging of the head and cervical spine was performed following the standard protocol without intravenous contrast. Multiplanar CT image reconstructions of the cervical spine were also generated. COMPARISON:  08/15/2013 brain MRI. 07/28/2009 cervical spine radiographs. FINDINGS: CT HEAD FINDINGS Brain: No evidence of parenchymal hemorrhage or extra-axial fluid collection. No mass lesion, mass effect, or midline shift. No CT evidence of acute infarction. Cerebral volume is age appropriate. No ventriculomegaly. Vascular: No acute abnormality. Skull: No evidence of  calvarial fracture. Sinuses/Orbits: The visualized paranasal sinuses are essentially clear. Other: Scalp emphysema in the left parieto-occipital region. The mastoid air cells are unopacified. CT CERVICAL SPINE FINDINGS Alignment: Straightening of the cervical spine. No facet subluxation. Dens is well positioned between the lateral masses of C1. Minimal 2 mm anterolisthesis at C7-T1, unchanged since 2011 radiographs. Skull base and vertebrae: No acute fracture. No primary bone lesion or focal pathologic process. Soft tissues and spinal canal: No prevertebral edema. No visible canal hematoma. Disc levels: Marked multilevel cervical degenerative disc disease, most prominent at C4-5 and C5-6. Mild bilateral facet arthropathy. Mild degenerative foraminal stenosis on the left at C3-4. Moderate degenerative foraminal stenosis bilaterally at C4-5. Mild degenerate foraminal stenosis on the left at C5-6. Upper chest: No acute abnormality. Other: Visualized mastoid air cells appear clear. No discrete thyroid nodules. No pathologically enlarged cervical nodes. IMPRESSION: CT HEAD: 1. Left  parieto-occipital scalp emphysema. 2. No evidence of acute intracranial abnormality. No evidence of calvarial fracture. CT CERVICAL SPINE: 1. No cervical spine fracture or acute subluxation. 2. Moderate to marked multilevel cervical degenerative changes as detailed. Electronically Signed   By: Ilona Sorrel M.D.   On: 06/26/2017 16:44   Ct Cervical Spine Wo Contrast  Result Date: 06/26/2017 CLINICAL DATA:  Fall from ladder with head injury. Occipital laceration. EXAM: CT HEAD WITHOUT CONTRAST CT CERVICAL SPINE WITHOUT CONTRAST TECHNIQUE: Multidetector CT imaging of the head and cervical spine was performed following the standard protocol without intravenous contrast. Multiplanar CT image reconstructions of the cervical spine were also generated. COMPARISON:  08/15/2013 brain MRI. 07/28/2009 cervical spine radiographs. FINDINGS: CT HEAD FINDINGS Brain: No evidence of parenchymal hemorrhage or extra-axial fluid collection. No mass lesion, mass effect, or midline shift. No CT evidence of acute infarction. Cerebral volume is age appropriate. No ventriculomegaly. Vascular: No acute abnormality. Skull: No evidence of calvarial fracture. Sinuses/Orbits: The visualized paranasal sinuses are essentially clear. Other: Scalp emphysema in the left parieto-occipital region. The mastoid air cells are unopacified. CT CERVICAL SPINE FINDINGS Alignment: Straightening of the cervical spine. No facet subluxation. Dens is well positioned between the lateral masses of C1. Minimal 2 mm anterolisthesis at C7-T1, unchanged since 2011 radiographs. Skull base and vertebrae: No acute fracture. No primary bone lesion or focal pathologic process. Soft tissues and spinal canal: No prevertebral edema. No visible canal hematoma. Disc levels: Marked multilevel cervical degenerative disc disease, most prominent at C4-5 and C5-6. Mild bilateral facet arthropathy. Mild degenerative foraminal stenosis on the left at C3-4. Moderate degenerative  foraminal stenosis bilaterally at C4-5. Mild degenerate foraminal stenosis on the left at C5-6. Upper chest: No acute abnormality. Other: Visualized mastoid air cells appear clear. No discrete thyroid nodules. No pathologically enlarged cervical nodes. IMPRESSION: CT HEAD: 1. Left parieto-occipital scalp emphysema. 2. No evidence of acute intracranial abnormality. No evidence of calvarial fracture. CT CERVICAL SPINE: 1. No cervical spine fracture or acute subluxation. 2. Moderate to marked multilevel cervical degenerative changes as detailed. Electronically Signed   By: Ilona Sorrel M.D.   On: 06/26/2017 16:44    Procedures .Marland KitchenLaceration Repair Date/Time: 06/26/2017 6:22 PM Performed by: Tacy Learn, PA-C Authorized by: Tacy Learn, PA-C   Consent:    Consent obtained:  Verbal   Consent given by:  Patient   Risks discussed:  Infection, need for additional repair, pain, poor cosmetic result, poor wound healing and retained foreign body   Alternatives discussed:  No treatment Anesthesia (see MAR for exact dosages):    Anesthesia method:  Local infiltration   Local anesthetic:  Lidocaine 1% w/o epi Laceration details:    Location:  Scalp   Scalp location:  Occipital   Length (cm):  9 ("2" shapped laceration)   Depth (mm):  5 Repair type:    Repair type:  Intermediate Pre-procedure details:    Preparation:  Patient was prepped and draped in usual sterile fashion and imaging obtained to evaluate for foreign bodies Exploration:    Wound exploration: entire depth of wound probed and visualized     Wound extent: no foreign bodies/material noted and no underlying fracture noted     Contaminated: no   Treatment:    Area cleansed with:  Saline   Amount of cleaning:  Extensive   Irrigation solution:  Sterile saline Fascia repair:    Suture size:  3-0   Suture material:  Vicryl   Suture technique:  Simple interrupted   Number of sutures:  2 (galea repair) Subcutaneous repair:     Suture size:  3-0   Suture material:  Vicryl   Suture technique:  Simple interrupted   Number of sutures:  1 Skin repair:    Repair method:  Sutures   Suture size:  4-0   Suture material:  Nylon   Suture technique:  Simple interrupted   Number of sutures: 5 simple interrupted, 1 running suture. Approximation:    Approximation:  Close Post-procedure details:    Patient tolerance of procedure:  Tolerated well, no immediate complications .Marland KitchenLaceration Repair Date/Time: 06/26/2017 6:31 PM Performed by: Tacy Learn, PA-C Authorized by: Tacy Learn, PA-C   Consent:    Consent obtained:  Verbal   Consent given by:  Patient   Risks discussed:  Infection, need for additional repair, pain, poor cosmetic result, poor wound healing and retained foreign body   Alternatives discussed:  No treatment Anesthesia (see MAR for exact dosages):    Anesthesia method:  Local infiltration   Local anesthetic:  Lidocaine 1% WITH epi Laceration details:    Location:  Shoulder/arm   Shoulder/arm location:  L lower arm   Length (cm):  5.5   Laceration depth: superficial distally, approx 1 cm deep proximal  Repair type:    Repair type:  Simple Pre-procedure details:    Preparation:  Patient was prepped and draped in usual sterile fashion Exploration:    Hemostasis achieved with:  Epinephrine   Wound exploration: wound explored through full range of motion and entire depth of wound probed and visualized     Wound extent: no foreign bodies/material noted, no muscle damage noted, no nerve damage noted and no tendon damage noted     Contaminated: no   Treatment:    Area cleansed with:  Saline   Amount of cleaning:  Standard   Irrigation solution:  Sterile saline Skin repair:    Repair method:  Sutures   Suture size:  4-0   Suture material:  Nylon   Suture technique:  Simple interrupted   Number of sutures:  6 Approximation:    Approximation:  Close Post-procedure details:    Patient  tolerance of procedure:  Tolerated well, no immediate complications   (including critical care time)  Medications Ordered in ED Medications  Tdap (BOOSTRIX) injection 0.5 mL (0.5 mLs Intramuscular Given 06/26/17 1643)  lidocaine (PF) (XYLOCAINE) 1 % injection 5 mL (5 mLs Intradermal Given by Other 06/26/17 1643)  lidocaine-EPINEPHrine (XYLOCAINE W/EPI) 2 %-1:200000 (PF) injection 10 mL (10 mLs Infiltration Given 06/26/17 1738)  Initial Impression / Assessment and Plan / ED Course  I have reviewed the triage vital signs and the nursing notes.  Pertinent labs & imaging results that were available during my care of the patient were reviewed by me and considered in my medical decision making (see chart for details).  Clinical Course as of Jun 26 1836  Mon Jun 26, 5020  [id number] 72 year old male presents with injuries before from a ladder today.  Patient states that he fell backwards off a ladder striking his head against letter before landing in the dirt.  Denies loss of consciousness, neck or back pain.  Patient is not on blood thinners, tetanus is not up-to-date.  Reports laceration to the back of his head as well as lacerations and skin tears to both arms and a bruise to the back of his left lower leg.  Has extremity pain or difficulty walking, denies abdominal pain.  No other injuries, complaints, concerns. CT of the head and neck are negative for acute injuries.  Complex laceration of the posterior scalp closed with multiple layers.  Patient was placed on Keflex prophylactically.  Laceration to the left forearm closed with simple interrupted sutures without difficulty.  Patient's multiple skin tears were cleaned and dressed by the nursing staff.  Tetanus was updated. Patient was also seen by Dr. Johnney Killian, er attending, agrees with plan, recommends Norflex  for muscle spasm, Tylenol as needed. Patient has meloxicam at home for pain- recommend he hold his meloxicam for the first 24 hours post fall.  Return to the ER- or present to any trauma center should he develop any concerning symptoms. Otherwise, follow up with PCP for wound check in 2 days.  Patient and wife verbalized understanding discharge instructions and plan.   [LM]    Clinical Course User Index [LM] Tacy Learn, PA-C      Final Clinical Impressions(s) / ED Diagnoses   Final diagnoses:  Fall from ladder, initial encounter  Laceration of occipital region of scalp, initial encounter  Laceration of skin of left forearm, initial encounter  Abrasion of right side of back, initial encounter  Multiple skin tears  Traumatic hematoma of left lower leg, initial encounter    ED Discharge Orders        Ordered    orphenadrine (NORFLEX) 100 MG tablet  2 times daily     06/26/17 1808    cephALEXin (KEFLEX) 500 MG capsule  3 times daily     06/26/17 1808       Tacy Learn, PA-C 06/26/17 Pecola Leisure, MD 06/28/17 [phone]

## 2017-06-26 NOTE — ED Notes (Signed)
ED Provider at bedside. 

## 2017-06-26 NOTE — Discharge Instructions (Signed)
Clean wounds with mild soap (Dove) as needed, on a q-tip. NO PEROXIDE.  Wound check with your family doctor in 2 days. Suture removal in 7 days (scalp), 12 days (forearm). Take Keflex to try and prevent infection. Return to the ER for any worsening or concerning symptoms.  Norflex as needed as prescribed as a muscle relaxor. Tylenol as directed for pain, may restart your meloxicam after 24 hours.

## 2017-06-28 DIAGNOSIS — Z5189 Encounter for other specified aftercare: Secondary | ICD-10-CM | POA: Diagnosis not present

## 2017-06-28 DIAGNOSIS — Z6823 Body mass index (BMI) 23.0-23.9, adult: Secondary | ICD-10-CM | POA: Diagnosis not present

## 2017-06-28 DIAGNOSIS — W11XXXD Fall on and from ladder, subsequent encounter: Secondary | ICD-10-CM | POA: Diagnosis not present

## 2017-06-28 DIAGNOSIS — T1490XD Injury, unspecified, subsequent encounter: Secondary | ICD-10-CM | POA: Diagnosis not present

## 2017-07-03 DIAGNOSIS — S0191XD Laceration without foreign body of unspecified part of head, subsequent encounter: Secondary | ICD-10-CM | POA: Diagnosis not present

## 2017-07-03 DIAGNOSIS — Z5189 Encounter for other specified aftercare: Secondary | ICD-10-CM | POA: Diagnosis not present

## 2017-07-03 DIAGNOSIS — W11XXXD Fall on and from ladder, subsequent encounter: Secondary | ICD-10-CM | POA: Diagnosis not present

## 2017-07-03 DIAGNOSIS — S56929D Laceration of unspecified muscles, fascia and tendons at forearm level, unspecified arm, subsequent encounter: Secondary | ICD-10-CM | POA: Diagnosis not present

## 2017-07-03 DIAGNOSIS — Z4802 Encounter for removal of sutures: Secondary | ICD-10-CM | POA: Diagnosis not present

## 2017-07-03 DIAGNOSIS — T1490XD Injury, unspecified, subsequent encounter: Secondary | ICD-10-CM | POA: Diagnosis not present

## 2017-07-03 DIAGNOSIS — Z6822 Body mass index (BMI) 22.0-22.9, adult: Secondary | ICD-10-CM | POA: Diagnosis not present

## 2017-07-17 ENCOUNTER — Other Ambulatory Visit (HOSPITAL_COMMUNITY): Payer: Self-pay | Admitting: Internal Medicine

## 2017-07-17 ENCOUNTER — Ambulatory Visit (HOSPITAL_COMMUNITY)
Admission: RE | Admit: 2017-07-17 | Discharge: 2017-07-17 | Disposition: A | Discharge status: Home / Self Care | Payer: PPO | Source: Ambulatory Visit | Attending: Surgery | Admitting: Surgery

## 2017-07-17 DIAGNOSIS — R600 Localized edema: Secondary | ICD-10-CM | POA: Diagnosis not present

## 2017-07-17 DIAGNOSIS — M79662 Pain in left lower leg: Secondary | ICD-10-CM | POA: Insufficient documentation

## 2017-07-17 DIAGNOSIS — G3184 Mild cognitive impairment, so stated: Secondary | ICD-10-CM | POA: Diagnosis not present

## 2017-07-17 DIAGNOSIS — R413 Other amnesia: Secondary | ICD-10-CM | POA: Diagnosis not present

## 2017-07-17 DIAGNOSIS — S060X0A Concussion without loss of consciousness, initial encounter: Secondary | ICD-10-CM | POA: Diagnosis not present

## 2017-07-28 DIAGNOSIS — G3184 Mild cognitive impairment, so stated: Secondary | ICD-10-CM | POA: Diagnosis not present

## 2017-07-28 DIAGNOSIS — R600 Localized edema: Secondary | ICD-10-CM | POA: Diagnosis not present

## 2017-07-28 DIAGNOSIS — Z6822 Body mass index (BMI) 22.0-22.9, adult: Secondary | ICD-10-CM | POA: Diagnosis not present

## 2017-07-28 DIAGNOSIS — R413 Other amnesia: Secondary | ICD-10-CM | POA: Diagnosis not present

## 2017-07-28 DIAGNOSIS — S060X0A Concussion without loss of consciousness, initial encounter: Secondary | ICD-10-CM | POA: Diagnosis not present

## 2017-09-13 DIAGNOSIS — D225 Melanocytic nevi of trunk: Secondary | ICD-10-CM | POA: Diagnosis not present

## 2017-09-13 DIAGNOSIS — B078 Other viral warts: Secondary | ICD-10-CM | POA: Diagnosis not present

## 2017-09-13 DIAGNOSIS — Z85828 Personal history of other malignant neoplasm of skin: Secondary | ICD-10-CM | POA: Diagnosis not present

## 2017-09-13 DIAGNOSIS — L821 Other seborrheic keratosis: Secondary | ICD-10-CM | POA: Diagnosis not present

## 2017-09-13 DIAGNOSIS — L814 Other melanin hyperpigmentation: Secondary | ICD-10-CM | POA: Diagnosis not present

## 2017-09-14 DIAGNOSIS — H43813 Vitreous degeneration, bilateral: Secondary | ICD-10-CM | POA: Diagnosis not present

## 2017-09-14 DIAGNOSIS — H2513 Age-related nuclear cataract, bilateral: Secondary | ICD-10-CM | POA: Diagnosis not present

## 2017-09-14 DIAGNOSIS — H04123 Dry eye syndrome of bilateral lacrimal glands: Secondary | ICD-10-CM | POA: Diagnosis not present

## 2017-09-14 DIAGNOSIS — H43393 Other vitreous opacities, bilateral: Secondary | ICD-10-CM | POA: Diagnosis not present

## 2017-09-18 ENCOUNTER — Telehealth: Payer: Self-pay | Admitting: Neurology

## 2017-09-18 NOTE — Telephone Encounter (Signed)
Called and LVM for pt letting him know there are no sooner appt at this time but I will watch for any sooner cx. If that occurs, I will call him.

## 2017-09-18 NOTE — Telephone Encounter (Signed)
Pt fell from a ladder about 2 months ago falling about 18 feet hitting the back of his head on one of the steps of the ladder. He went to ED, had a CT which was normal. He began having HA's about one month ago. He is having flashing lights when looking to the side right more than left and floaters for the past 3 weeks. He saw an opthamalogist this past Friday 9/13 was advised to see a neurologist within the next few days. Pt also said he talked with a representative over the phone from [employer] last Tuesday and was advised of the same. He also was working under the crawl space of his house 2-3 weeks ago and hit his head several times. I have moved his appt from 9/20 to 9/18, he is aware Dr Jannifer Franklin does not have anything available. The patient wants to know if he should be seen sooner than Wednesday??

## 2017-09-20 ENCOUNTER — Telehealth: Payer: Self-pay | Admitting: Neurology

## 2017-09-20 ENCOUNTER — Encounter

## 2017-09-20 ENCOUNTER — Encounter: Payer: Self-pay | Admitting: Neurology

## 2017-09-20 ENCOUNTER — Ambulatory Visit (INDEPENDENT_AMBULATORY_CARE_PROVIDER_SITE_OTHER): Payer: PPO | Admitting: Neurology

## 2017-09-20 VITALS — BP 101/65 | HR 51 | Ht 69.0 in | Wt 156.5 lb

## 2017-09-20 DIAGNOSIS — G4489 Other headache syndrome: Secondary | ICD-10-CM | POA: Diagnosis not present

## 2017-09-20 DIAGNOSIS — R413 Other amnesia: Secondary | ICD-10-CM | POA: Diagnosis not present

## 2017-09-20 NOTE — Telephone Encounter (Signed)
[facility] order sent to [facility]. No auth they will reach out to the pt to schedule.  °

## 2017-09-20 NOTE — Progress Notes (Signed)
Reason for visit: Headache, memory disturbance  Referring physician: [name] is a 72 y.o. male  History of present illness:  Jay Ayers is a 72 year old right-handed white male with a history of mild cognitive impairment.  The patient was last seen in June 2016 through this office.  The patient comes in today for evaluation of a new problem.  On 26 June 2017, the patient was up on a ladder and fell off, striking his head.  The patient does not believe that he lost consciousness, he felt dazed for a short period of time.  The patient had a laceration on his head, he did go to the emergency room and underwent CT scan of the brain and cervical spine that were unremarkable.  The patient did well for about a month but then began having some problems with headaches that would tend to occur in the evening hours around 10 PM, the headaches were mild in nature, and usually throughout the head.  More recently, the headaches have been on the retro-orbital regions on the left or right side spreading to the back of the head.  The patient may feel somewhat cloudy headed at times, he denies any true vertigo.  He has not had any focal numbness or weakness of the face, arms, legs.  He denies any problems with speech or swallowing.  He has had some floaters in the vision, he went to see his ophthalmologist and no evidence of a retinal problem was seen.  The patient comes to the office today for further evaluation.  Overall, he has been functioning fairly well.  Past Medical History:  Diagnosis Date  . Arthritis    knees, back , shoulders. hx. Polycythermia Rheumatica  . Clotting disorder ([place])    "tends to bleed easily"  . GERD (gastroesophageal reflux disease)   . Memory disturbance 08/06/2013   evaluated- memory appears to be worsening- no meds helped  . Mitral valve prolapse   . Neuromuscular disorder ([place])   . PMR (polymyalgia rheumatica) (HCC)   . Prostate cancer ([place])   .  Weakness of back     Past Surgical History:  Procedure Laterality Date  . APPENDECTOMY  1958  . BACK SURGERY  1995  . COLONOSCOPY WITH PROPOFOL N/A 06/02/2015   Procedure: COLONOSCOPY WITH PROPOFOL;  Surgeon: Garlan Fair, MD;  Location: [facility];  Service: Endoscopy;  Laterality: N/A;  . EYE SURGERY     "cyst on eye excised"  . MINOR NAILBED REPAIR Right 04/25/2013   Procedure: RIGHT THUMB NAIL BIOPSY    (MINOR PROCEDURE);  Surgeon: Cammie Sickle., MD;  Location: [facility];  Service: Orthopedics;  Laterality: Right;  clean class  . PILONIDAL CYST EXCISION    . PROSTATE SURGERY  2005  . ROTATOR CUFF REPAIR Right 2002  . SPINE SURGERY     only epidural injection in back after lumbar surgery several yrs ago  . TONSILLECTOMY  1964  . TRANSURETHRAL RESECTION OF PROSTATE  1998  . VASECTOMY  1991    Family History  Problem Relation Age of Onset  . Stroke Mother   . Heart failure Mother   . Emphysema Father   . Multiple sclerosis Brother   . Heart Problems Brother   . Prostate cancer Brother   . Leukemia Brother   . Multiple sclerosis Unknown   . Prostate cancer Unknown   . Heart Problems Unknown     Social history:  reports that he has never smoked. He has never used smokeless tobacco. He reports that he drinks alcohol. He reports that he does not use drugs.  Medications:  Prior to Admission medications   Medication Sig Start Date End Date Taking? Authorizing Provider  meloxicam (MOBIC) 15 MG tablet Take 15 mg by mouth daily.   Yes [provider]  Multiple Vitamin (MULTIVITAMIN) tablet Take 1 tablet by mouth daily.   Yes [provider]  ranitidine (ZANTAC) 150 MG tablet Take 150 mg by mouth daily.    Yes [provider]      Allergies  Allergen Reactions  . Nitrofurantoin Rash    ROS:  Out of a complete 14 system review of symptoms, the patient complains only of the following symptoms, and all other reviewed  systems are negative.  Ringing in the ears Blurred vision, eye pain Easy bruising, easy bleeding Memory loss, headache, difficulty swallowing Anxiety  Blood pressure 101/65, pulse (!) 51, height 5\' 9"  (1.753 m), weight 156 lb 8 oz (71 kg).  Physical Exam  General: The patient is alert and cooperative at the time of the examination.  Eyes: Pupils are equal, round, and reactive to light. Discs are flat bilaterally.  Neck: The neck is supple, no carotid bruits are noted.  Respiratory: The respiratory examination is clear.  Cardiovascular: The cardiovascular examination reveals a regular rate and rhythm, no obvious murmurs or rubs are noted.  Skin: Extremities are without significant edema.  Neurologic Exam  Mental status: The patient is alert and oriented x 3 at the time of the examination. The patient has apparent normal recent and remote memory, with an apparently normal attention span and concentration ability.  Mini-Mental status examination done today shows a total score of 30/30.  Cranial nerves: Facial symmetry is present. There is good sensation of the face to pinprick and soft touch bilaterally. The strength of the facial muscles and the muscles to head turning and shoulder shrug are normal bilaterally. Speech is well enunciated, no aphasia or dysarthria is noted. Extraocular movements are full. Visual fields are full. The tongue is midline, and the patient has symmetric elevation of the soft palate. No obvious hearing deficits are noted.  Motor: The motor testing reveals 5 over 5 strength of all 4 extremities. Good symmetric motor tone is noted throughout.  Sensory: Sensory testing is intact to pinprick, soft touch, vibration sensation, and position sense on all 4 extremities. No evidence of extinction is noted.  Coordination: Cerebellar testing reveals good finger-nose-finger and heel-to-shin bilaterally.  Gait and station: Gait is normal. Tandem gait is normal. Romberg is  negative. No drift is seen.  Reflexes: Deep tendon reflexes are symmetric and normal bilaterally. Toes are downgoing bilaterally.   CT head and cervical 06/26/17:  IMPRESSION: CT HEAD:  1. Left parieto-occipital scalp emphysema. 2. No evidence of acute intracranial abnormality. No evidence of calvarial fracture.  CT CERVICAL SPINE:  1. No cervical spine fracture or acute subluxation. 2. Moderate to marked multilevel cervical degenerative changes as detailed.  * CT scan images were reviewed online. I agree with the written report.    Assessment/Plan:  1.  History of head trauma, mild concussion  2.  Headaches  3.  Mild cognitive impairment  The patient is having some headaches that began a month after his original fall.  For this reason, CT scan of the brain will be done to rule out a small subdural hematoma.  If this study is unremarkable, the patient may  return to all previous activities.  He will otherwise follow-up as needed.  Jay Alexanders MD 09/20/2017 10:46 AM  [facility] [address] [place], [place] [address]  Phone [phone] Fax [phone]

## 2017-09-22 ENCOUNTER — Ambulatory Visit
Admission: RE | Admit: 2017-09-22 | Discharge: 2017-09-22 | Disposition: A | Discharge status: Home / Self Care | Payer: PPO | Source: Ambulatory Visit | Attending: Neurology | Admitting: Neurology

## 2017-09-22 ENCOUNTER — Institutional Professional Consult (permissible substitution): Payer: PPO | Admitting: Neurology

## 2017-09-22 ENCOUNTER — Telehealth: Payer: Self-pay | Admitting: Neurology

## 2017-09-22 DIAGNOSIS — R413 Other amnesia: Secondary | ICD-10-CM

## 2017-09-22 DIAGNOSIS — G4489 Other headache syndrome: Secondary | ICD-10-CM

## 2017-09-22 DIAGNOSIS — R51 Headache: Secondary | ICD-10-CM | POA: Diagnosis not present

## 2017-09-22 NOTE — Telephone Encounter (Signed)
  I called the patient.  The CT of the head is unremarkable, no evidence of subdural hematoma.  CT head 09/22/17:  IMPRESSION: This is a normal age-appropriate CT scan of the head.  There are no acute findings.  When compared to the CT scan dated 06/26/2017, the small posttraumatic scalp lesion has resolved.

## 2017-11-21 ENCOUNTER — Telehealth: Payer: Self-pay | Admitting: Neurology

## 2017-11-21 MED ORDER — GABAPENTIN 100 MG PO CAPS: 200.0000 mg | ORAL_CAPSULE | Freq: Every day | ORAL | 3 refills | Status: DC

## 2017-11-21 NOTE — Addendum Note (Signed)
Addended by: Kathrynn Ducking on: 11/21/2017 04:44 PM   Modules accepted: Orders

## 2017-11-21 NOTE — Telephone Encounter (Signed)
Patient had a concussion this past June when he fell off a ladder and hit his head. Every night since then around 10pm he has a headache. During the day he is alright. Please call and discuss.

## 2017-11-21 NOTE — Telephone Encounter (Signed)
I called the patient.  He is having his ongoing daily headaches at 10 PM, he generally does not have headaches throughout the day.  He may try to cut back on his caffeine intake, I will send in a prescription for low-dose gabapentin.

## 2017-12-20 ENCOUNTER — Telehealth: Payer: Self-pay | Admitting: Neurology

## 2017-12-20 MED ORDER — GABAPENTIN 400 MG PO CAPS: 400.0000 mg | ORAL_CAPSULE | Freq: Every day | ORAL | 2 refills | Status: DC

## 2017-12-20 NOTE — Telephone Encounter (Signed)
I called the patient.  The patient is on 200 mg of gabapentin at night, he is tolerating the drug but it is not helping the headache much, we will go to 400 mg in the evening.

## 2017-12-20 NOTE — Telephone Encounter (Signed)
Pt has called asking for a call from Dr Jannifer Franklin to discuss concerns he has about his gabapentin (NEURONTIN) 100 MG capsule

## 2017-12-20 NOTE — Addendum Note (Signed)
Addended by: Kathrynn Ducking on: 12/20/2017 11:02 AM   Modules accepted: Orders

## 2018-02-21 ENCOUNTER — Telehealth: Payer: Self-pay | Admitting: Neurology

## 2018-02-21 MED ORDER — GABAPENTIN 100 MG PO CAPS: ORAL_CAPSULE | ORAL | 1 refills | Status: DC

## 2018-02-21 NOTE — Telephone Encounter (Signed)
I called the patient.  The patient appears to want to come down off of the gabapentin, we will start him on 300 mg at night, taper by 1 capsule every 2 weeks until off the drug.  A prescription was sent in.

## 2018-02-21 NOTE — Telephone Encounter (Signed)
Pt states that he is needing a refill on his gabapentin (NEURONTIN) 400 MG capsule but is wanting to get off of it so he has several questions on how he can proceed to do so. Please advise.

## 2018-03-13 DIAGNOSIS — Z125 Encounter for screening for malignant neoplasm of prostate: Secondary | ICD-10-CM | POA: Diagnosis not present

## 2018-03-13 DIAGNOSIS — R5383 Other fatigue: Secondary | ICD-10-CM | POA: Diagnosis not present

## 2018-03-13 DIAGNOSIS — R7989 Other specified abnormal findings of blood chemistry: Secondary | ICD-10-CM | POA: Diagnosis not present

## 2018-03-13 DIAGNOSIS — R82998 Other abnormal findings in urine: Secondary | ICD-10-CM | POA: Diagnosis not present

## 2018-03-13 DIAGNOSIS — Z79899 Other long term (current) drug therapy: Secondary | ICD-10-CM | POA: Diagnosis not present

## 2018-03-15 DIAGNOSIS — D692 Other nonthrombocytopenic purpura: Secondary | ICD-10-CM | POA: Diagnosis not present

## 2018-03-15 DIAGNOSIS — Z0000 Encounter for general adult medical examination without abnormal findings: Secondary | ICD-10-CM | POA: Diagnosis not present

## 2018-03-15 DIAGNOSIS — K635 Polyp of colon: Secondary | ICD-10-CM | POA: Diagnosis not present

## 2018-03-15 DIAGNOSIS — N401 Enlarged prostate with lower urinary tract symptoms: Secondary | ICD-10-CM | POA: Diagnosis not present

## 2018-03-15 DIAGNOSIS — Z1331 Encounter for screening for depression: Secondary | ICD-10-CM | POA: Diagnosis not present

## 2018-03-15 DIAGNOSIS — G3184 Mild cognitive impairment, so stated: Secondary | ICD-10-CM | POA: Diagnosis not present

## 2018-03-15 DIAGNOSIS — M353 Polymyalgia rheumatica: Secondary | ICD-10-CM | POA: Diagnosis not present

## 2018-03-15 DIAGNOSIS — Z1339 Encounter for screening examination for other mental health and behavioral disorders: Secondary | ICD-10-CM | POA: Diagnosis not present

## 2018-03-15 DIAGNOSIS — Z6823 Body mass index (BMI) 23.0-23.9, adult: Secondary | ICD-10-CM | POA: Diagnosis not present

## 2018-03-15 DIAGNOSIS — F5221 Male erectile disorder: Secondary | ICD-10-CM | POA: Diagnosis not present

## 2018-03-15 DIAGNOSIS — C61 Malignant neoplasm of prostate: Secondary | ICD-10-CM | POA: Diagnosis not present

## 2018-03-15 DIAGNOSIS — R209 Unspecified disturbances of skin sensation: Secondary | ICD-10-CM | POA: Diagnosis not present

## 2018-03-20 DIAGNOSIS — Z1212 Encounter for screening for malignant neoplasm of rectum: Secondary | ICD-10-CM | POA: Diagnosis not present

## 2018-05-10 DIAGNOSIS — Z20828 Contact with and (suspected) exposure to other viral communicable diseases: Secondary | ICD-10-CM | POA: Diagnosis not present

## 2018-08-17 ENCOUNTER — Encounter: Payer: Self-pay | Admitting: Interventional Cardiology

## 2018-08-17 ENCOUNTER — Ambulatory Visit: Payer: PPO | Admitting: Interventional Cardiology

## 2018-08-17 ENCOUNTER — Other Ambulatory Visit: Payer: Self-pay

## 2018-08-17 VITALS — BP 132/72 | HR 55 | Ht 69.0 in | Wt 154.6 lb

## 2018-08-17 DIAGNOSIS — R072 Precordial pain: Secondary | ICD-10-CM

## 2018-08-17 DIAGNOSIS — R079 Chest pain, unspecified: Secondary | ICD-10-CM

## 2018-08-17 DIAGNOSIS — Z7189 Other specified counseling: Secondary | ICD-10-CM | POA: Diagnosis not present

## 2018-08-17 DIAGNOSIS — R413 Other amnesia: Secondary | ICD-10-CM

## 2018-08-17 DIAGNOSIS — G709 Myoneural disorder, unspecified: Secondary | ICD-10-CM

## 2018-08-17 DIAGNOSIS — R0609 Other forms of dyspnea: Secondary | ICD-10-CM

## 2018-08-17 MED ORDER — ASPIRIN EC 81 MG PO TBEC: 81.0000 mg | DELAYED_RELEASE_TABLET | Freq: Every day | ORAL | 3 refills | Status: DC

## 2018-08-17 MED ORDER — NITROGLYCERIN 0.4 MG SL SUBL: 0.4000 mg | SUBLINGUAL_TABLET | SUBLINGUAL | 3 refills | Status: DC | PRN

## 2018-08-17 MED ORDER — ROSUVASTATIN CALCIUM 10 MG PO TABS: 10.0000 mg | ORAL_TABLET | Freq: Every day | ORAL | 3 refills | Status: DC

## 2018-08-17 MED ORDER — METOPROLOL TARTRATE 25 MG PO TABS: ORAL_TABLET | ORAL | 0 refills | Status: DC

## 2018-08-17 NOTE — Progress Notes (Signed)
Cardiology Office Note:    Date:  08/17/2018   ID:  Jay Ayers, DOB [birth year], MRN [id number]  PCP:  Shon Baton, MD  Cardiologist:  No primary care provider on file.   Referring MD: Shon Baton, MD   Chief Complaint  Patient presents with  . Chest Pain  . Shortness of Breath    History of Present Illness:    Jay Ayers is a 73 y.o. male with a hx of mitral valve prolapse although no firm documentation exists, prior history of chest pain with low risk myocardial perfusion study, and 6-week history of exertional dyspnea and chest tightness.  He is a patient of Dr. Shon Baton who relayed this history and we asked the patient to come in for evaluation.  The patient is here today.  He admits to having memory difficulty.  He cannot remember if chest discomfort now is in any way similar to what he had for years ago.  The discomfort is present in his chest currently.  Is very mild and he has noted that it seems to be consistently present.  Tightness develops if he exerts himself and he feels short of breath.  It goes away with rest.  He has had no diaphoresis, syncope, or other complaint.  He is in no current significant distress.  It is no worse today than it has been over the past 6 weeks.  Past Medical History:  Diagnosis Date  . Arthritis    knees, back , shoulders. hx. Polycythermia Rheumatica  . Clotting disorder ([place])    "tends to bleed easily"  . GERD (gastroesophageal reflux disease)   . Memory disturbance 08/06/2013   evaluated- memory appears to be worsening- no meds helped  . Mitral valve prolapse   . Neuromuscular disorder ([place])   . PMR (polymyalgia rheumatica) (HCC)   . Prostate cancer ([place])   . Weakness of back     Past Surgical History:  Procedure Laterality Date  . APPENDECTOMY  1958  . BACK SURGERY  1995  . COLONOSCOPY WITH PROPOFOL N/A 06/02/2015   Procedure: COLONOSCOPY WITH PROPOFOL;  Surgeon: Garlan Fair, MD;  Location: [facility];  Service:  Endoscopy;  Laterality: N/A;  . EYE SURGERY     "cyst on eye excised"  . MINOR NAILBED REPAIR Right 04/25/2013   Procedure: RIGHT THUMB NAIL BIOPSY    (MINOR PROCEDURE);  Surgeon: Cammie Sickle., MD;  Location: [facility];  Service: Orthopedics;  Laterality: Right;  clean class  . PILONIDAL CYST EXCISION    . PROSTATE SURGERY  2005  . ROTATOR CUFF REPAIR Right 2002  . SPINE SURGERY     only epidural injection in back after lumbar surgery several yrs ago  . TONSILLECTOMY  1964  . TRANSURETHRAL RESECTION OF PROSTATE  1998  . VASECTOMY  1991    Current Medications: Current Meds  Medication Sig  . meloxicam (MOBIC) 15 MG tablet Take 15 mg by mouth daily.  . Multiple Vitamin (MULTIVITAMIN) tablet Take 1 tablet by mouth daily.     Allergies:   Nitrofurantoin   Social History   Socioeconomic History  . Marital status: Married    Spouse name: Not on file  . Number of children: 5  . Years of education: BS  . Highest education level: Not on file  Occupational History  . Occupation: Retired  Scientific laboratory technician  . Financial resource strain: Not on file  . Food insecurity    Worry: Not on  file    Inability: Not on file  . Transportation needs    Medical: Not on file    Non-medical: Not on file  Tobacco Use  . Smoking status: Never Smoker  . Smokeless tobacco: Never Used  Substance and Sexual Activity  . Alcohol use: Yes    Comment: occ  . Drug use: No  . Sexual activity: Not on file  Lifestyle  . Physical activity    Days per week: Not on file    Minutes per session: Not on file  . Stress: Not on file  Relationships  . Social Herbalist on phone: Not on file    Gets together: Not on file    Attends religious service: Not on file    Active member of club or organization: Not on file    Attends meetings of clubs or organizations: Not on file    Relationship status: Not on file  Other Topics Concern  . Not on file  Social History Narrative    Lives   Patient is right handed.   Patient drinks 3 cups caffeine daily.     Family History: The patient's family history includes Emphysema in his father; Heart Problems in his brother and unknown relative; Heart failure in his mother; Leukemia in his brother; Multiple sclerosis in his brother and unknown relative; Prostate cancer in his brother and unknown relative; Stroke in his mother.  ROS:   Please see the history of present illness.    He has musculoskeletal complaints.  He denies orthopnea, PND, but has had an occasional palpitation over the last several weeks.  All other systems reviewed and are negative.  EKGs/Labs/Other Studies Reviewed:    The following studies were reviewed today: Nuclear stress test 2016:  Study Highlights   Nuclear stress EF: 58%.  This is a low risk study.  The left ventricular ejection fraction is normal (55-65%).   Normal stress perfuson and no chest pain with normal EF 58% Positive ECG with exercise      EKG:  EKG sinus bradycardia at 55 bpm.  1 PVC.  Leftward axis.  No change in EKG appearance compared to 2016 with the exception of the one PVC noted on today's tracing.  Recent Labs: No results found for requested labs within last 8760 hours.  Recent Lipid Panel No results found for: CHOL, TRIG, HDL, CHOLHDL, VLDL, LDLCALC, LDLDIRECT  Physical Exam:    VS:  BP 132/72   Pulse (!) 55   Ht 5\' 9"  (1.753 m)   Wt 154 lb 9.6 oz (70.1 kg)   SpO2 98%   BMI 22.83 kg/m     Wt Readings from Last 3 Encounters:  08/17/18 154 lb 9.6 oz (70.1 kg)  09/20/17 156 lb 8 oz (71 kg)  06/26/17 153 lb 14.1 oz (69.8 kg)     GEN: Slender and healthy appearing appropriate for age.. No acute distress HEENT: Normal NECK: No JVD. LYMPHATICS: No lymphadenopathy CARDIAC:  RRR without murmur, gallop, or edema. VASCULAR:  Normal Pulses. No bruits. RESPIRATORY:  Clear to auscultation without rales, wheezing or rhonchi  ABDOMEN: Soft, non-tender,  non-distended, No pulsatile mass, MUSCULOSKELETAL: No deformity  SKIN: Warm and dry NEUROLOGIC:  Alert and oriented x 3 PSYCHIATRIC:  Normal affect   ASSESSMENT:    1. Chest pain, unspecified type   2. Dyspnea on exertion   3. Neuromuscular disorder ([place])   4. Memory disturbance   5. Educated About Covid-19 Virus Infection  PLAN:    In order of problems listed above:  1. Chest discomfort with accompanying dyspnea is concerning for coronary disease.  He needs an ischemic evaluation and I prefer anatomic data.  I recommended coronary angiography and explained the rationale.  We also discussed coronary CTA.  In shared decision making, the patient does not want to take the risk of coronary angiography.  He understands a coronary CTA will not be possible for 10 days.  He notes that he still preferred to wait and he will let me know if symptoms worsen.  In the meantime he will refrain from activities that aggravate chest discomfort.  I have asked him to take a baby aspirin 81 mg/day, rosuvastatin 10 mg/day, and given him a prescription for sublingual nitroglycerin.  Should he develop significant worsening in symptoms, we will set him up for urgent catheterization and or he should come to the emergency room if significant discomfort unresolved with nitro.   The patient was counseled to undergo left heart catheterization, coronary angiography, and possible percutaneous coronary intervention with stent implantation. The procedural risks and benefits were discussed in detail. The risks discussed included death, stroke, myocardial infarction, life-threatening bleeding, limb ischemia, kidney injury, allergy, and possible emergency cardiac surgery. The risk of these significant complications were estimated to occur less than 1% of the time. After discussion, the patient has agreed to proceed.  Greater than 50% of the time during this office visit was spent in education, counseling, and coordination of  care related to underlying disease process and testing as outlined.    Medication Adjustments/Labs and Tests Ordered: Current medicines are reviewed at length with the patient today.  Concerns regarding medicines are outlined above.  No orders of the defined types were placed in this encounter.  No orders of the defined types were placed in this encounter.   There are no Patient Instructions on file for this visit.   Signed, Sinclair Grooms, MD  08/17/2018 3:45 PM    [place]

## 2018-08-17 NOTE — Addendum Note (Signed)
Addended by: Loren Racer on: 08/17/2018 04:08 PM   Modules accepted: Orders

## 2018-08-17 NOTE — Patient Instructions (Addendum)
Medication Instructions:  1) START Aspirin 81mg  once daily 2) A prescription has been sent in for Nitroglycerin.  If you have chest pain that doesn't relieve quickly, place one tablet under your tongue and allow it to dissolve.  If no relief after 5 minutes, you may take another pill.  If no relief after 5 minutes, you may take a 3rd dose but you need to call 911 and report to ER immediately. 3) START Rosuvastatin 10mg  once daily  If you need a refill on your cardiac medications before your next appointment, please call your pharmacy.   Lab work: BMET today  Your physician recommends that you return for lab work in: 6-8 weeks (Lipid, Liver).  You do need to be fasting for these labs (nothing to eat or drink after midnight except water and black coffee)  If you have labs (blood work) drawn today and your tests are completely normal, you will receive your results only by: Marland Kitchen MyChart Message (if you have MyChart) OR . A paper copy in the mail If you have any lab test that is abnormal or we need to change your treatment, we will call you to review the results.  Testing/Procedures: Your physician recommends that you have a Coronary CT performed.  Follow-Up: Follow up will be based on outcome of CT  Any Other Special Instructions Will Be Listed Below (If Applicable).  Your cardiac CT will be scheduled at one of the below locations:   [facility] [address] [place], [place] [address] (336) [place] [address] [place], [place] [address] [phone]  Please arrive at the [facility] main entrance of [facility] 30-45 minutes prior to test start time. Proceed to the [facility] Radiology Department (first floor) to check-in and test prep.  Please follow these instructions carefully (unless otherwise directed):  Hold all erectile dysfunction medications at least 48 hours prior to test.  On the  Night Before the Test: . Be sure to Drink plenty of water. . Do not consume any caffeinated/decaffeinated beverages or chocolate 12 hours prior to your test. . Do not take any antihistamines 12 hours prior to your test. . If you take Metformin do not take 24 hours prior to test.  On the Day of the Test: . Drink plenty of water. Do not drink any water within one hour of the test. . Do not eat any food 4 hours prior to the test. . You may take your regular medications prior to the test.  . Take metoprolol (Lopressor) two hours prior to test. . HOLD Furosemide/Hydrochlorothiazide morning of the test.        After the Test: . Drink plenty of water. . After receiving IV contrast, you may experience a mild flushed feeling. This is normal. . On occasion, you may experience a mild rash up to 24 hours after the test. This is not dangerous. If this occurs, you can take Benadryl 25 mg and increase your fluid intake. . If you experience trouble breathing, this can be serious. If it is severe call 911 IMMEDIATELY. If it is mild, please call our office. . If you take any of these medications: Glipizide/Metformin, Avandament, Glucavance, please do not take 48 hours after completing test.    Please contact the cardiac imaging nurse navigator should you have any questions/concerns Marchia Bond, RN Navigator Cardiac [place] [facility] [phone] Office  [phone] Cell

## 2018-08-18 LAB — BASIC METABOLIC PANEL
BUN/Creatinine Ratio: 20 (ref 10–24)
BUN: 20 mg/dL (ref 8–27)
CO2: 26 mmol/L (ref 20–29)
Calcium: 9.2 mg/dL (ref 8.6–10.2)
Chloride: 101 mmol/L (ref 96–106)
Creatinine, Ser: 1.02 mg/dL (ref 0.76–1.27)
GFR calc Af Amer: 83 mL/min/{1.73_m2} (ref >59)
GFR calc non Af Amer: 72 mL/min/{1.73_m2} (ref >59)
Glucose: 95 mg/dL (ref 65–99)
Potassium: 4.5 mmol/L (ref 3.5–5.2)
Sodium: 141 mmol/L (ref 134–144)

## 2018-08-21 ENCOUNTER — Telehealth (HOSPITAL_COMMUNITY): Payer: Self-pay | Admitting: Emergency Medicine

## 2018-08-21 ENCOUNTER — Telehealth: Payer: Self-pay | Admitting: Interventional Cardiology

## 2018-08-21 NOTE — Telephone Encounter (Signed)
New Message  Patient states that during his last visit with Dr. Tamala Julian that it was mentioned for him to have a test done where a camera is sent through his veins to look at his heart. Patient is calling back to talk to Dr. Tamala Julian to get that order put in for that test. Please call patient back to assist.

## 2018-08-21 NOTE — Telephone Encounter (Signed)
Left message on voicemail with name and callback number Marinda Tyer RN Navigator Cardiac Imaging [facility] [phone] Office [phone] Cell  

## 2018-08-21 NOTE — Telephone Encounter (Signed)
Spoke with pt to see if he needed me for anything as phone call came through at 8:33A and I am just receiving and pt's CT has already been moved.  Pt states that he was just calling to see about getting CT sooner or going ahead with cath.  Now that CT is scheduled for tomorrow he would like to move forward with that plan first.  Pt appreciative for call.

## 2018-08-22 ENCOUNTER — Other Ambulatory Visit: Payer: Self-pay

## 2018-08-22 ENCOUNTER — Ambulatory Visit
Admission: RE | Admit: 2018-08-22 | Discharge: 2018-08-22 | Disposition: A | Discharge status: Home / Self Care | Payer: PPO | Source: Ambulatory Visit | Attending: Interventional Cardiology | Admitting: Interventional Cardiology

## 2018-08-22 DIAGNOSIS — R079 Chest pain, unspecified: Secondary | ICD-10-CM | POA: Insufficient documentation

## 2018-08-22 DIAGNOSIS — R072 Precordial pain: Secondary | ICD-10-CM | POA: Insufficient documentation

## 2018-08-22 MED ORDER — IOHEXOL 350 MG/ML SOLN
100.0000 mL | Freq: Once | INTRAVENOUS | Status: AC | PRN
  Administered 2018-08-22: 100 mL via INTRAVENOUS

## 2018-08-22 MED ORDER — NITROGLYCERIN 0.4 MG SL SUBL
0.8000 mg | SUBLINGUAL_TABLET | Freq: Once | SUBLINGUAL | Status: AC
  Administered 2018-08-22: 0.8 mg via SUBLINGUAL

## 2018-08-22 NOTE — Progress Notes (Signed)
Patient tolerated CT without incident. Drank a soda. Ambulatory steady gait to exit.

## 2018-08-24 ENCOUNTER — Telehealth: Payer: Self-pay | Admitting: Interventional Cardiology

## 2018-08-24 NOTE — Telephone Encounter (Signed)
Please call with CT results.

## 2018-08-24 NOTE — Telephone Encounter (Signed)
Spoke with and went over results and recommendations.

## 2018-08-27 ENCOUNTER — Ambulatory Visit (HOSPITAL_COMMUNITY): Payer: PPO

## 2018-09-06 ENCOUNTER — Telehealth: Payer: Self-pay | Admitting: Interventional Cardiology

## 2018-09-06 NOTE — Telephone Encounter (Signed)
Left detailed message with information from Dr. Tamala Julian.  Advised to call back tomorrow if further issues.

## 2018-09-06 NOTE — Telephone Encounter (Signed)
If he is having great concern he needs ER. CT would suggest pain is not cardiac.

## 2018-09-06 NOTE — Telephone Encounter (Addendum)
° ° °  Pt c/o of Chest Pain: STAT if CP now or developed within 24 hours  1. Are you having CP right now? DULL PAIN  2. Are you experiencing any other symptoms (ex. SOB, nausea, vomiting, sweating)?DIZZINESS, SOB while doing yard work  3. How long have you been experiencing CP? WEEKS  4. Is your CP continuous or coming and going? COMING AND GOING  5. Have you taken Nitroglycerin? NO ?

## 2018-09-06 NOTE — Telephone Encounter (Signed)
I spoke to the patient who saw Dr Tamala Julian on 8/14 and had a cardiac CT on 8/19.  He is calling because he continues to have intermittent CP, arm discomfort, SOB and dizziness.  This morning he felt light headed and sweaty.    He followed up with PCP recently about SOB and was told to return to cardiologist.  His vitals have been fine BP 117/75, HR normal.  He is calling to get further advisement from Dr Tamala Julian.

## 2018-09-07 NOTE — Telephone Encounter (Signed)
Follow up ° ° °Patient is returning call. Please call. °

## 2018-09-07 NOTE — Telephone Encounter (Signed)
I spoke with pt who reports he received message from [facility] yesterday. He is calling today because he continues to have chest pain and feeling out of breath.  Had a spell yesterday while reading the paper. Was unable to sleep last night. He reports he continues to have chest pain which radiates to his arm. I gave pt information from Dr. Tamala Julian. I advised him that Dr. Tamala Julian recommends ED if he continues to have symptoms.

## 2018-09-08 ENCOUNTER — Other Ambulatory Visit: Payer: Self-pay

## 2018-09-08 ENCOUNTER — Emergency Department (HOSPITAL_COMMUNITY): Payer: PPO

## 2018-09-08 ENCOUNTER — Emergency Department (HOSPITAL_COMMUNITY)
Admission: EM | Admit: 2018-09-08 | Discharge: 2018-09-08 | Disposition: A | Discharge status: Home / Self Care | Payer: PPO | Attending: Emergency Medicine | Admitting: Emergency Medicine

## 2018-09-08 DIAGNOSIS — Z79899 Other long term (current) drug therapy: Secondary | ICD-10-CM | POA: Insufficient documentation

## 2018-09-08 DIAGNOSIS — Z7982 Long term (current) use of aspirin: Secondary | ICD-10-CM | POA: Insufficient documentation

## 2018-09-08 DIAGNOSIS — R0789 Other chest pain: Secondary | ICD-10-CM

## 2018-09-08 DIAGNOSIS — C61 Malignant neoplasm of prostate: Secondary | ICD-10-CM | POA: Diagnosis not present

## 2018-09-08 DIAGNOSIS — Z20828 Contact with and (suspected) exposure to other viral communicable diseases: Secondary | ICD-10-CM | POA: Diagnosis not present

## 2018-09-08 DIAGNOSIS — R0602 Shortness of breath: Secondary | ICD-10-CM | POA: Insufficient documentation

## 2018-09-08 DIAGNOSIS — R079 Chest pain, unspecified: Secondary | ICD-10-CM | POA: Diagnosis not present

## 2018-09-08 LAB — CBC
HCT: 46.2 % (ref 39.0–52.0)
Hemoglobin: 15 g/dL (ref 13.0–17.0)
MCH: 30.7 pg (ref 26.0–34.0)
MCHC: 32.5 g/dL (ref 30.0–36.0)
MCV: 94.7 fL (ref 80.0–100.0)
Platelets: 211 10*3/uL (ref 150–400)
RBC: 4.88 MIL/uL (ref 4.22–5.81)
RDW: 12.1 % (ref 11.5–15.5)
WBC: 5 10*3/uL (ref 4.0–10.5)
nRBC: 0 % (ref 0.0–0.2)

## 2018-09-08 LAB — BASIC METABOLIC PANEL
Anion gap: 8 (ref 5–15)
BUN: 10 mg/dL (ref 8–23)
CO2: 27 mmol/L (ref 22–32)
Calcium: 9.1 mg/dL (ref 8.9–10.3)
Chloride: 102 mmol/L (ref 98–111)
Creatinine, Ser: 1.07 mg/dL (ref 0.61–1.24)
GFR calc Af Amer: >60 mL/min (ref >60)
GFR calc non Af Amer: >60 mL/min (ref >60)
Glucose, Bld: 82 mg/dL (ref 70–99)
Potassium: 4.7 mmol/L (ref 3.5–5.1)
Sodium: 137 mmol/L (ref 135–145)

## 2018-09-08 LAB — TROPONIN I (HIGH SENSITIVITY)
Troponin I (High Sensitivity): 10 ng/L (ref <18)
Troponin I (High Sensitivity): 10 ng/L (ref <18)

## 2018-09-08 LAB — BRAIN NATRIURETIC PEPTIDE: B Natriuretic Peptide: 72.2 pg/mL (ref 0.0–100.0)

## 2018-09-08 LAB — SARS CORONAVIRUS 2 (TAT 6-24 HRS): SARS Coronavirus 2: NEGATIVE

## 2018-09-08 MED ORDER — SODIUM CHLORIDE 0.9% FLUSH: 3.0000 mL | Freq: Once | INTRAVENOUS | Status: DC

## 2018-09-08 NOTE — Discharge Instructions (Addendum)
As discussed, your evaluation today has been largely reassuring.  But, it is important that you monitor your condition carefully, and do not hesitate to return to the ED if you develop new, or concerning changes in your condition.  Otherwise, please follow-up with your physician for appropriate ongoing care.  Please discuss today's evaluation and consideration of echocardiogram or catheterization as indicated.  Your test results have been reassuring. Your coronavirus test is still pending, you will be made aware of abnormal findings.  Or, you may check for this result in the next 24 hours via the MyChart function.

## 2018-09-08 NOTE — ED Provider Notes (Signed)
[facility] EMERGENCY DEPARTMENT Provider Note   CSN: [id number] Arrival date & time: 09/08/18  0900     History   Chief Complaint Chief Complaint  Patient presents with  . Chest Pain    HPI Jay Ayers is a 73 y.o. male.     HPI Patient presents with dyspnea on exertion and chest pain. They are not necessarily concurrent, but he has developed both over the past month. He notes that exertion is now more difficult, than prior to 1 month ago, with easy fatigability, and dyspnea. Occasionally he also has left-sided chest pressure, left arm discomfort, though this is not consistent with exertion, nor with activity or motion. He occasionally has some left-sided chest pain at rest, but not dyspnea. Patient has been seen and evaluated by his cardiologist, has had CT coronary, completed, with reportedly unremarkable results. He denies history of cardiac disease, or substantial medical problems. No recent medication change, diet change, activity change.   Past Medical History:  Diagnosis Date  . Arthritis    knees, back , shoulders. hx. Polycythermia Rheumatica  . Clotting disorder ([place])    "tends to bleed easily"  . GERD (gastroesophageal reflux disease)   . Memory disturbance 08/06/2013   evaluated- memory appears to be worsening- no meds helped  . Mitral valve prolapse   . Neuromuscular disorder ([place])   . PMR (polymyalgia rheumatica) (HCC)   . Prostate cancer ([place])   . Weakness of back     Patient Active Problem List   Diagnosis Date Noted  . Chest pain 06/30/2014  . Neuromuscular disorder ([place]) 06/30/2014  . Prostate cancer ([place]) 06/30/2014  . Clotting disorder ([place]) 06/30/2014  . Memory disturbance 08/06/2013    Past Surgical History:  Procedure Laterality Date  . APPENDECTOMY  1958  . BACK SURGERY  1995  . COLONOSCOPY WITH PROPOFOL N/A 06/02/2015   Procedure: COLONOSCOPY WITH PROPOFOL;  Surgeon: Garlan Fair, MD;  Location: [facility]  ENDOSCOPY;  Service: Endoscopy;  Laterality: N/A;  . EYE SURGERY     "cyst on eye excised"  . MINOR NAILBED REPAIR Right 04/25/2013   Procedure: RIGHT THUMB NAIL BIOPSY    (MINOR PROCEDURE);  Surgeon: Cammie Sickle., MD;  Location: [facility];  Service: Orthopedics;  Laterality: Right;  clean class  . PILONIDAL CYST EXCISION    . PROSTATE SURGERY  2005  . ROTATOR CUFF REPAIR Right 2002  . SPINE SURGERY     only epidural injection in back after lumbar surgery several yrs ago  . TONSILLECTOMY  1964  . TRANSURETHRAL RESECTION OF PROSTATE  1998  . VASECTOMY  1991        Home Medications    Prior to Admission medications   Medication Sig Start Date End Date Taking? Authorizing Provider  acetaminophen (TYLENOL) 325 MG tablet Take 650 mg by mouth every 6 (six) hours as needed for mild pain or headache.   Yes [provider]  Artificial Tear Ointment (DRY EYES OP) Apply 1-2 drops to eye daily as needed (FOR DRY EYES).   Yes [provider]  aspirin EC 81 MG tablet Take 1 tablet (81 mg total) by mouth daily. 08/17/18  Yes Belva Crome, MD  ibuprofen (ADVIL) 200 MG tablet Take 200 mg by mouth every 6 (six) hours as needed for headache or moderate pain.   Yes [provider]  meloxicam (MOBIC) 15 MG tablet Take 15 mg by mouth daily as needed for pain.  Yes [provider]  Multiple Vitamin (MULTIVITAMIN) tablet Take 1 tablet by mouth daily.   Yes [provider]  nitroGLYCERIN (NITROSTAT) 0.4 MG SL tablet Place 1 tablet (0.4 mg total) under the tongue every 5 (five) minutes as needed for chest pain. 08/17/18 11/15/18 Yes Belva Crome, MD  rosuvastatin (CRESTOR) 10 MG tablet Take 1 tablet (10 mg total) by mouth daily. 08/17/18 11/15/18 Yes Belva Crome, MD  metoprolol tartrate (LOPRESSOR) 25 MG tablet Take one tablet by mouth two hours prior to your CT Patient not taking: Reported on 09/08/2018 08/17/18   Belva Crome, MD     Family History Family History  Problem Relation Age of Onset  . Stroke Mother   . Heart failure Mother   . Emphysema Father   . Multiple sclerosis Brother   . Heart Problems Brother   . Prostate cancer Brother   . Leukemia Brother   . Multiple sclerosis Other   . Prostate cancer Other   . Heart Problems Other     Social History Social History   Tobacco Use  . Smoking status: Never Smoker  . Smokeless tobacco: Never Used  Substance Use Topics  . Alcohol use: Yes    Comment: occ  . Drug use: No     Allergies   Nitrofurantoin   Review of Systems Review of Systems  Constitutional:       Per HPI, otherwise negative  HENT:       Per HPI, otherwise negative  Respiratory:       Per HPI, otherwise negative  Cardiovascular:       Per HPI, otherwise negative  Gastrointestinal: Negative for vomiting.  Endocrine:       Negative aside from HPI  Genitourinary:       Neg aside from HPI   Musculoskeletal:       Per HPI, otherwise negative  Skin: Negative.   Neurological: Negative for syncope.     Physical Exam Updated Vital Signs BP 130/73   Pulse (!) 51   Resp 12   Ht 5\' 9"  (1.753 m)   Wt 68 kg   SpO2 100%   BMI 22.15 kg/m   Physical Exam Vitals signs and nursing note reviewed.  Constitutional:      General: He is not in acute distress.    Appearance: He is well-developed.  HENT:     Head: Normocephalic and atraumatic.  Eyes:     Conjunctiva/sclera: Conjunctivae normal.  Cardiovascular:     Rate and Rhythm: Normal rate and regular rhythm.  Pulmonary:     Effort: Pulmonary effort is normal. No respiratory distress.     Breath sounds: No stridor.  Abdominal:     General: There is no distension.  Skin:    General: Skin is warm and dry.  Neurological:     Mental Status: He is alert and oriented to person, place, and time.      ED Treatments / Results  Labs (all labs ordered are listed, but only abnormal results are displayed) Labs Reviewed   SARS CORONAVIRUS 2 (TAT 6-24 HRS)  BASIC METABOLIC PANEL  CBC  BRAIN NATRIURETIC PEPTIDE  TROPONIN I (HIGH SENSITIVITY)  TROPONIN I (HIGH SENSITIVITY)    EKG EKG Interpretation  Date/Time:  Saturday September 08 2018 09:14:17 EDT Ventricular Rate:  52 PR Interval:  158 QRS Duration: 82 QT Interval:  390 QTC Calculation: 362 R Axis:   88 Text Interpretation:  Sinus bradycardia Baseline wander Artifact Abnormal  ECG Confirmed by Carmin Muskrat [phone]) on 09/08/2018 9:45:13 AM   Radiology Dg Chest 2 View  Result Date: 09/08/2018 CLINICAL DATA:  Chest pain radiating to left shoulder for 3-4 weeks. Shortness of breath and dizziness. EXAM: CHEST - 2 VIEW COMPARISON:  None. FINDINGS: The heart size and mediastinal contours are within normal limits. Both lungs are clear. The visualized skeletal structures are unremarkable. IMPRESSION: No active cardiopulmonary disease. Electronically Signed   By: Marlaine Hind M.D.   On: 09/08/2018 10:07    Procedures Procedures (including critical care time)  Medications Ordered in ED Medications  sodium chloride flush (NS) 0.9 % injection 3 mL (has no administration in time range)     Initial Impression / Assessment and Plan / ED Course  I have reviewed the triage vital signs and the nursing notes.  Pertinent labs & imaging results that were available during my care of the patient were reviewed by me and considered in my medical decision making (see chart for details).    After the initial evaluation I reviewed the patient's chart including CT imaging results from last month: IMPRESSION: 1. Coronary calcium score of 79. This was 75 percentile for age and sex matched control.   2. Normal coronary origin with right dominance.   3. Minimal non-obstructive CAD (0-24%). Consider non-atherosclerotic causes of chest pain. Consider preventive therapy and risk factor modification.   Electronically Signed: By: Ena Dawley On: 08/22/2018 16:09      12:45 PM Patient in no distress sitting upright reading a book. Now, 2 troponins are normal, BNP unremarkable x-ray clear, no distress, no evidence for ongoing ischemia Patient has no hypoxia, no tachypnea, no tachycardia, no clinical evidence for pulmonary embolism With his ongoing dyspnea with exertion, he will follow-up with cardiology for consideration echocardiogram versus catheterization, and primary care with consideration of possible pulmonology consult showed all cardiology tests come back normal. Patient amenable to discharge after lengthy conversation, discharged in stable condition.  Final Clinical Impressions(s) / ED Diagnoses   Final diagnoses:  Shortness of breath  Atypical chest pain     Carmin Muskrat, MD 09/08/18 1248

## 2018-09-08 NOTE — ED Triage Notes (Signed)
Pt to ER for evaluation of left chest pain with radiation to left arm, shortness of breath, and occasionally diaphoresis while at rest. States has been ongoing for 3-4 weeks, but recently he has been starting to experience these episodes more while at rest, whereas previously it was occurring with exertion and would resolve with rest. Reports had CT coronary completed by Dr Tamala Julian with [facility] with no abnormal findings for his age, reports he was given nitroglycerin but hasn't taken any.

## 2018-09-11 ENCOUNTER — Telehealth: Payer: Self-pay | Admitting: Interventional Cardiology

## 2018-09-11 NOTE — Telephone Encounter (Signed)
Pt seen in ED over the weekend for SOB and atypical CP.  Note from ED says speak with cardiology about echo vs heart cath.  Pt calling about setting echo up.  Will route to Dr. Tamala Julian to see if ok to order echo?

## 2018-09-11 NOTE — Telephone Encounter (Signed)
New Message     Pt is calling and would like to schedule an Echo   Please call

## 2018-09-12 ENCOUNTER — Other Ambulatory Visit: Payer: Self-pay

## 2018-09-12 ENCOUNTER — Encounter: Payer: Self-pay | Admitting: Interventional Cardiology

## 2018-09-12 ENCOUNTER — Ambulatory Visit: Payer: PPO | Admitting: Interventional Cardiology

## 2018-09-12 VITALS — BP 118/64 | HR 50 | Ht 69.0 in | Wt 155.0 lb

## 2018-09-12 DIAGNOSIS — Z7189 Other specified counseling: Secondary | ICD-10-CM | POA: Diagnosis not present

## 2018-09-12 DIAGNOSIS — R079 Chest pain, unspecified: Secondary | ICD-10-CM

## 2018-09-12 DIAGNOSIS — I341 Nonrheumatic mitral (valve) prolapse: Secondary | ICD-10-CM

## 2018-09-12 DIAGNOSIS — R0609 Other forms of dyspnea: Secondary | ICD-10-CM | POA: Diagnosis not present

## 2018-09-12 NOTE — Telephone Encounter (Signed)
Spoke with pt and he will come to office today at 3pm to discuss.

## 2018-09-12 NOTE — Patient Instructions (Signed)
Medication Instructions:  Your physician recommends that you continue on your current medications as directed. Please refer to the Current Medication list given to you today.  If you need a refill on your cardiac medications before your next appointment, please call your pharmacy.   Lab work: None If you have labs (blood work) drawn today and your tests are completely normal, you will receive your results only by: Marland Kitchen MyChart Message (if you have MyChart) OR . A paper copy in the mail If you have any lab test that is abnormal or we need to change your treatment, we will call you to review the results.  Testing/Procedures: Your physician has requested that you have an echocardiogram. Echocardiography is a painless test that uses sound waves to create images of your heart. It provides your doctor with information about the size and shape of your heart and how well your heart's chambers and valves are working. This procedure takes approximately one hour. There are no restrictions for this procedure.    Follow-Up: Your physician recommends that you schedule a follow-up appointment as needed with Dr. Tamala Julian.    Any Other Special Instructions Will Be Listed Below (If Applicable).

## 2018-09-12 NOTE — Progress Notes (Signed)
Cardiology Office Note:    Date:  09/12/2018   ID:  Jay Ayers, DOB October 26, 1945, MRN [id number]  PCP:  Shon Baton, MD  Cardiologist:  No primary care provider on file.   Referring MD: Shon Baton, MD   Chief Complaint  Patient presents with  . Shortness of Breath  . Chest Pain    History of Present Illness:    Jay Ayers is a 73 y.o. male with a hx of vague chest discomfort and dyspnea.  Recent coronary CTA revealed plaque with less than 30% stenosis.  Still feeling vaguely short of breath and having poorly described chest discomfort that can last for hours.  Recent emergency room visit revealed normal EKG during chest discomfort and negative cardiac high-sensitivity troponin markers.  No orthopnea, PND, or syncopal episodes.  Started to consider chronotropic incompetence since his resting heart rate is 50.  He exercises regularly and monitors his heart rates.  With bicycle riding and weightlifting heart rate can climb above 125 bpm.  This suggests that chronotropic incompetence is not a problem.  Discomfort and dyspnea are relatively mild and can last for hours.  The emergency room visit did not demonstrate any problems although it was recommended that an echocardiogram be performed.  Past Medical History:  Diagnosis Date  . Arthritis    knees, back , shoulders. hx. Polycythermia Rheumatica  . Clotting disorder ([place])    "tends to bleed easily"  . GERD (gastroesophageal reflux disease)   . Memory disturbance 08/06/2013   evaluated- memory appears to be worsening- no meds helped  . Mitral valve prolapse   . Neuromuscular disorder ([place])   . PMR (polymyalgia rheumatica) (HCC)   . Prostate cancer ([place])   . Weakness of back     Past Surgical History:  Procedure Laterality Date  . APPENDECTOMY  1958  . BACK SURGERY  1995  . COLONOSCOPY WITH PROPOFOL N/A 06/02/2015   Procedure: COLONOSCOPY WITH PROPOFOL;  Surgeon: Garlan Fair, MD;  Location: [facility] ENDOSCOPY;  Service:  Endoscopy;  Laterality: N/A;  . EYE SURGERY     "cyst on eye excised"  . MINOR NAILBED REPAIR Right 04/25/2013   Procedure: RIGHT THUMB NAIL BIOPSY    (MINOR PROCEDURE);  Surgeon: Cammie Sickle., MD;  Location: [facility];  Service: Orthopedics;  Laterality: Right;  clean class  . PILONIDAL CYST EXCISION    . PROSTATE SURGERY  2005  . ROTATOR CUFF REPAIR Right 2002  . SPINE SURGERY     only epidural injection in back after lumbar surgery several yrs ago  . TONSILLECTOMY  1964  . TRANSURETHRAL RESECTION OF PROSTATE  1998  . VASECTOMY  1991    Current Medications: Current Meds  Medication Sig  . acetaminophen (TYLENOL) 325 MG tablet Take 650 mg by mouth every 6 (six) hours as needed for mild pain or headache.  . Artificial Tear Ointment (DRY EYES OP) Apply 1-2 drops to eye daily as needed (FOR DRY EYES).  Marland Kitchen aspirin EC 81 MG tablet Take 1 tablet (81 mg total) by mouth daily.  Marland Kitchen ibuprofen (ADVIL) 200 MG tablet Take 200 mg by mouth every 6 (six) hours as needed for headache or moderate pain.  . meloxicam (MOBIC) 15 MG tablet Take 15 mg by mouth daily as needed for pain.   . Multiple Vitamin (MULTIVITAMIN) tablet Take 1 tablet by mouth daily.  . nitroGLYCERIN (NITROSTAT) 0.4 MG SL tablet Place 1 tablet (0.4 mg total) under the tongue  every 5 (five) minutes as needed for chest pain.  . rosuvastatin (CRESTOR) 10 MG tablet Take 1 tablet (10 mg total) by mouth daily.     Allergies:   Nitrofurantoin   Social History   Socioeconomic History  . Marital status: Married    Spouse name: Not on file  . Number of children: 5  . Years of education: BS  . Highest education level: Not on file  Occupational History  . Occupation: Retired  Scientific laboratory technician  . Financial resource strain: Not on file  . Food insecurity    Worry: Not on file    Inability: Not on file  . Transportation needs    Medical: Not on file    Non-medical: Not on file  Tobacco Use  . Smoking status: Never  Smoker  . Smokeless tobacco: Never Used  Substance and Sexual Activity  . Alcohol use: Yes    Comment: occ  . Drug use: No  . Sexual activity: Not on file  Lifestyle  . Physical activity    Days per week: Not on file    Minutes per session: Not on file  . Stress: Not on file  Relationships  . Social Herbalist on phone: Not on file    Gets together: Not on file    Attends religious service: Not on file    Active member of club or organization: Not on file    Attends meetings of clubs or organizations: Not on file    Relationship status: Not on file  Other Topics Concern  . Not on file  Social History Narrative   Lives   Patient is right handed.   Patient drinks 3 cups caffeine daily.     Family History: The patient's family history includes Emphysema in his father; Heart Problems in his brother and another family member; Heart failure in his mother; Leukemia in his brother; Multiple sclerosis in his brother and another family member; Prostate cancer in his brother and another family member; Stroke in his mother.  ROS:   Please see the history of present illness.    Decreased memory.  All other systems reviewed and are negative.  EKGs/Labs/Other Studies Reviewed:    The following studies were reviewed today: Coronary CT with morphology 08/23/2018 IMPRESSION: 1. Coronary calcium score of 79. This was 86 percentile for age and sex matched control.  2. Normal coronary origin with right dominance.  3. Minimal non-obstructive CAD (0-24%). Consider non-atherosclerotic causes of chest pain. Consider preventive therapy and risk factor modification.  EKG:  EKG sinus bradycardia 50 bpm.  Poor R wave progression V1 through V4.  When compared to the tracing performed in the emergency room on 09/08/2018, no change has occurred.  Recent Labs: 09/08/2018: B Natriuretic Peptide 72.2; BUN 10; Creatinine, Ser 1.07; Hemoglobin 15.0; Platelets 211; Potassium 4.7; Sodium 137   Recent Lipid Panel No results found for: CHOL, TRIG, HDL, CHOLHDL, VLDL, LDLCALC, LDLDIRECT  Physical Exam:    VS:  BP 118/64   Pulse (!) 50   Ht 5\' 9"  (1.753 m)   Wt 155 lb (70.3 kg)   SpO2 100%   BMI 22.89 kg/m     Wt Readings from Last 3 Encounters:  09/12/18 155 lb (70.3 kg)  09/08/18 150 lb (68 kg)  08/17/18 154 lb 9.6 oz (70.1 kg)     GEN: Healthy-appearing. No acute distress HEENT: Normal NECK: No JVD. LYMPHATICS: No lymphadenopathy CARDIAC:  RRR without murmur, gallop, or edema. VASCULAR:  Normal Pulses. No bruits. RESPIRATORY:  Clear to auscultation without rales, wheezing or rhonchi  ABDOMEN: Soft, non-tender, non-distended, No pulsatile mass, MUSCULOSKELETAL: No deformity  SKIN: Warm and dry NEUROLOGIC:  Alert and oriented x 3 PSYCHIATRIC:  Normal affect   ASSESSMENT:    1. Chest pain, unspecified type   2. Dyspnea on exertion   3. Mitral valve prolapse   4. Educated About Covid-19 Virus Infection    PLAN:    In order of problems listed above:  1. Chest pain and dyspnea of uncertain etiology.  With a low risk coronary CT angiogram and atypical symptoms including long-duration chest discomfort and dyspnea, ischemia as the source seems unlikely. 2. 2D Doppler echocardiogram will be evaluated.  An exercise treadmill test to rule out chronotropic incompetence is not performed based upon information by the patient which suggest that his heart rate can get into the 130s with bicycling. 3. No auscultatory evidence of mitral valve prolapse. 4. Mask wearing, social distancing, and handwashing is encouraged.   Medication Adjustments/Labs and Tests Ordered: Current medicines are reviewed at length with the patient today.  Concerns regarding medicines are outlined above.  Orders Placed This Encounter  Procedures  . EKG 12-Lead  . ECHOCARDIOGRAM COMPLETE   No orders of the defined types were placed in this encounter.   Patient Instructions  Medication  Instructions:  Your physician recommends that you continue on your current medications as directed. Please refer to the Current Medication list given to you today.  If you need a refill on your cardiac medications before your next appointment, please call your pharmacy.   Lab work: None If you have labs (blood work) drawn today and your tests are completely normal, you will receive your results only by: Marland Kitchen MyChart Message (if you have MyChart) OR . A paper copy in the mail If you have any lab test that is abnormal or we need to change your treatment, we will call you to review the results.  Testing/Procedures: Your physician has requested that you have an echocardiogram. Echocardiography is a painless test that uses sound waves to create images of your heart. It provides your doctor with information about the size and shape of your heart and how well your heart's chambers and valves are working. This procedure takes approximately one hour. There are no restrictions for this procedure.    Follow-Up: Your physician recommends that you schedule a follow-up appointment as needed with Dr. Tamala Julian.    Any Other Special Instructions Will Be Listed Below (If Applicable).       Signed, Sinclair Grooms, MD  09/12/2018 5:00 PM    [place]

## 2018-09-19 ENCOUNTER — Ambulatory Visit (HOSPITAL_COMMUNITY): Payer: PPO | Attending: Cardiology

## 2018-09-19 ENCOUNTER — Other Ambulatory Visit: Payer: Self-pay

## 2018-09-19 DIAGNOSIS — R0609 Other forms of dyspnea: Secondary | ICD-10-CM | POA: Diagnosis not present

## 2018-09-24 ENCOUNTER — Telehealth: Payer: Self-pay | Admitting: Interventional Cardiology

## 2018-09-24 NOTE — Telephone Encounter (Signed)
I spoke to the patient and informed him that he still needs labs (FLP/HFT) drawn on 9/25, because he recently started Crestor.  He verbalized understanding.

## 2018-09-24 NOTE — Telephone Encounter (Signed)
New Message    Patient wants to know if he still needs to do the lab work.  Please call to advise.

## 2018-09-28 ENCOUNTER — Other Ambulatory Visit: Payer: Self-pay

## 2018-09-28 ENCOUNTER — Other Ambulatory Visit: Payer: PPO | Admitting: *Deleted

## 2018-09-28 DIAGNOSIS — R0609 Other forms of dyspnea: Secondary | ICD-10-CM | POA: Diagnosis not present

## 2018-09-28 DIAGNOSIS — R079 Chest pain, unspecified: Secondary | ICD-10-CM

## 2018-09-28 LAB — LIPID PANEL
Chol/HDL Ratio: 3.6 ratio (ref 0.0–5.0)
Cholesterol, Total: 118 mg/dL (ref 100–199)
HDL: 33 mg/dL — ABNORMAL LOW (ref >39)
LDL Chol Calc (NIH): 70 mg/dL (ref 0–99)
Triglycerides: 76 mg/dL (ref 0–149)
VLDL Cholesterol Cal: 15 mg/dL (ref 5–40)

## 2018-09-28 LAB — HEPATIC FUNCTION PANEL
ALT: 16 IU/L (ref 0–44)
AST: 22 IU/L (ref 0–40)
Albumin: 4.3 g/dL (ref 3.7–4.7)
Alkaline Phosphatase: 103 IU/L (ref 39–117)
Bilirubin Total: 0.9 mg/dL (ref 0.0–1.2)
Bilirubin, Direct: 0.23 mg/dL (ref 0.00–0.40)
Total Protein: 6.9 g/dL (ref 6.0–8.5)

## 2018-10-11 DIAGNOSIS — Z23 Encounter for immunization: Secondary | ICD-10-CM | POA: Diagnosis not present

## 2018-11-12 ENCOUNTER — Ambulatory Visit: Payer: PPO | Admitting: Interventional Cardiology

## 2018-11-23 ENCOUNTER — Telehealth: Payer: Self-pay | Admitting: Interventional Cardiology

## 2018-11-23 NOTE — Telephone Encounter (Signed)
Spoke with pt and made him aware to continue Rosuvastatin.  Pt verbalized understanding.

## 2018-11-23 NOTE — Telephone Encounter (Signed)
Patient states that he had been taking rosuvastatin (CRESTOR) 10 MG tablet(Expired). He said after tests were ran on his heart, everything seems to be looking good. He needs to know if the medication needs to be refilled so he can keep taking it.

## 2018-12-26 ENCOUNTER — Other Ambulatory Visit: Payer: Self-pay

## 2018-12-26 ENCOUNTER — Encounter: Payer: Self-pay | Admitting: Interventional Cardiology

## 2018-12-26 ENCOUNTER — Ambulatory Visit: Payer: PPO | Admitting: Interventional Cardiology

## 2018-12-26 ENCOUNTER — Telehealth: Payer: Self-pay | Admitting: Interventional Cardiology

## 2018-12-26 VITALS — BP 124/68 | HR 52 | Ht 69.0 in | Wt 155.4 lb

## 2018-12-26 DIAGNOSIS — I341 Nonrheumatic mitral (valve) prolapse: Secondary | ICD-10-CM | POA: Diagnosis not present

## 2018-12-26 DIAGNOSIS — R079 Chest pain, unspecified: Secondary | ICD-10-CM

## 2018-12-26 DIAGNOSIS — Z7189 Other specified counseling: Secondary | ICD-10-CM

## 2018-12-26 DIAGNOSIS — R413 Other amnesia: Secondary | ICD-10-CM | POA: Diagnosis not present

## 2018-12-26 DIAGNOSIS — E785 Hyperlipidemia, unspecified: Secondary | ICD-10-CM

## 2018-12-26 NOTE — H&P (View-Only) (Signed)
Cardiology Office Note:    Date:  12/26/2018   ID:  Jay Ayers, DOB 03/17/1945, MRN [id number]  PCP:  Shon Baton, MD  Cardiologist:  No primary care provider on file.   Referring MD: Shon Baton, MD   Chief Complaint  Patient presents with  . Chest Pain    History of Present Illness:    Jay Ayers is a 73 y.o. male with a hx of vague chest discomfort, dyspnea, and ecent coronary CTA revealing plaque with less than 30% stenosis.  Overall the patient is concerned because he continues to have exertional chest tightness that radiates into his throat.  If he stops to rest the discomfort goes away.  It is been worse now that the weather has changed.  He does not have the discomfort at rest.  He also has occasional palpitations at other times.  Palpitations are not associated with discomfort.  Past Medical History:  Diagnosis Date  . Arthritis    knees, back , shoulders. hx. Polycythermia Rheumatica  . Clotting disorder ([place])    "tends to bleed easily"  . GERD (gastroesophageal reflux disease)   . Memory disturbance 08/06/2013   evaluated- memory appears to be worsening- no meds helped  . Mitral valve prolapse   . Neuromuscular disorder ([place])   . PMR (polymyalgia rheumatica) (HCC)   . Prostate cancer ([place])   . Weakness of back     Past Surgical History:  Procedure Laterality Date  . APPENDECTOMY  1958  . BACK SURGERY  1995  . COLONOSCOPY WITH PROPOFOL N/A 06/02/2015   Procedure: COLONOSCOPY WITH PROPOFOL;  Surgeon: Garlan Fair, MD;  Location: [facility];  Service: Endoscopy;  Laterality: N/A;  . EYE SURGERY     "cyst on eye excised"  . MINOR NAILBED REPAIR Right 04/25/2013   Procedure: RIGHT THUMB NAIL BIOPSY    (MINOR PROCEDURE);  Surgeon: Cammie Sickle., MD;  Location: [facility];  Service: Orthopedics;  Laterality: Right;  clean class  . PILONIDAL CYST EXCISION    . PROSTATE SURGERY  2005  . ROTATOR CUFF REPAIR Right 2002  . SPINE  SURGERY     only epidural injection in back after lumbar surgery several yrs ago  . TONSILLECTOMY  1964  . TRANSURETHRAL RESECTION OF PROSTATE  1998  . VASECTOMY  1991    Current Medications: Current Meds  Medication Sig  . acetaminophen (TYLENOL) 325 MG tablet Take 650 mg by mouth every 6 (six) hours as needed for mild pain or headache.  . Artificial Tear Ointment (DRY EYES OP) Apply 1-2 drops to eye daily as needed (FOR DRY EYES).  Marland Kitchen aspirin EC 81 MG tablet Take 1 tablet (81 mg total) by mouth daily.  Marland Kitchen ibuprofen (ADVIL) 200 MG tablet Take 200 mg by mouth every 6 (six) hours as needed for headache or moderate pain.  . meloxicam (MOBIC) 15 MG tablet Take 15 mg by mouth daily as needed for pain.   . Multiple Vitamin (MULTIVITAMIN) tablet Take 1 tablet by mouth daily.  . nitroGLYCERIN (NITROSTAT) 0.4 MG SL tablet Place 1 tablet (0.4 mg total) under the tongue every 5 (five) minutes as needed for chest pain.  . rosuvastatin (CRESTOR) 10 MG tablet Take 1 tablet (10 mg total) by mouth daily.     Allergies:   Nitrofurantoin   Social History   Socioeconomic History  . Marital status: Married    Spouse name: Not on file  . Number of children:  5  . Years of education: BS  . Highest education level: Not on file  Occupational History  . Occupation: Retired  Tobacco Use  . Smoking status: Never Smoker  . Smokeless tobacco: Never Used  Substance and Sexual Activity  . Alcohol use: Yes    Comment: occ  . Drug use: No  . Sexual activity: Not on file  Other Topics Concern  . Not on file  Social History Narrative   Lives   Patient is right handed.   Patient drinks 3 cups caffeine daily.   Social Determinants of Health   Financial Resource Strain:   . Difficulty of Paying Living Expenses: Not on file  Food Insecurity:   . Worried About Charity fundraiser in the Last Year: Not on file  . Ran Out of Food in the Last Year: Not on file  Transportation Needs:   . Lack of  Transportation (Medical): Not on file  . Lack of Transportation (Non-Medical): Not on file  Physical Activity:   . Days of Exercise per Week: Not on file  . Minutes of Exercise per Session: Not on file  Stress:   . Feeling of Stress : Not on file  Social Connections:   . Frequency of Communication with Friends and Family: Not on file  . Frequency of Social Gatherings with Friends and Family: Not on file  . Attends Religious Services: Not on file  . Active Member of Clubs or Organizations: Not on file  . Attends Archivist Meetings: Not on file  . Marital Status: Not on file     Family History: The patient's family history includes Emphysema in his father; Heart Problems in his brother and another family member; Heart failure in his mother; Leukemia in his brother; Multiple sclerosis in his brother and another family member; Prostate cancer in his brother and another family member; Stroke in his mother.  ROS:   Please see the history of present illness.    Decreased memory all other systems reviewed and are negative.  EKGs/Labs/Other Studies Reviewed:    The following studies were reviewed today:  2D Doppler echocardiogram September 2020 IMPRESSIONS   1. The left ventricle has normal systolic function, with an ejection fraction of 55-60%. The cavity size was normal. Left ventricular diastolic Doppler parameters are consistent with impaired relaxation. No evidence of left ventricular regional wall  motion abnormalities.  2. The right ventricle has normal systolic function. The cavity was normal. There is no increase in right ventricular wall thickness.  3. Mild mitral valve prolapse.  4. The mitral valve is myxomatous. Mild thickening of the mitral valve leaflet.  5. The aortic valve is tricuspid. Mild thickening of the aortic valve. Aortic valve regurgitation is mild by color flow Doppler.  6. The aorta is normal unless otherwise noted.  Coronary CTA with FFR August  2020:  IMPRESSION: 1. Coronary calcium score of 79. This was 68 percentile for age and sex matched control.  2. Normal coronary origin with right dominance.  3. Minimal non-obstructive CAD (0-24%). Consider non-atherosclerotic causes of chest pain. Consider preventive therapy and risk factor modification.     EKG:  EKG none  Recent Labs: 09/08/2018: B Natriuretic Peptide 72.2; BUN 10; Creatinine, Ser 1.07; Hemoglobin 15.0; Platelets 211; Potassium 4.7; Sodium 137 09/28/2018: ALT 16  Recent Lipid Panel    Component Value Date/Time   CHOL 118 09/28/2018 0901   TRIG 76 09/28/2018 0901   HDL 33 (L) 09/28/2018 0901  CHOLHDL 3.6 09/28/2018 0901   LDLCALC 70 09/28/2018 0901    Physical Exam:    VS:  BP 124/68   Pulse (!) 52   Ht 5\' 9"  (1.753 m)   Wt 155 lb 6.4 oz (70.5 kg)   SpO2 99%   BMI 22.95 kg/m     Wt Readings from Last 3 Encounters:  12/26/18 155 lb 6.4 oz (70.5 kg)  09/12/18 155 lb (70.3 kg)  09/08/18 150 lb (68 kg)     GEN: Slender. No acute distress HEENT: Normal NECK: No JVD. LYMPHATICS: No lymphadenopathy CARDIAC:  RRR without murmur, gallop, or edema. VASCULAR:  Normal Pulses. No bruits. RESPIRATORY:  Clear to auscultation without rales, wheezing or rhonchi  ABDOMEN: Soft, non-tender, non-distended, No pulsatile mass, MUSCULOSKELETAL: No deformity  SKIN: Warm and dry NEUROLOGIC:  Alert and oriented x 3 PSYCHIATRIC:  Normal affect   ASSESSMENT:    1. Chest pain, unspecified type   2. Mitral valve prolapse   3. Memory disturbance   4. Hyperlipidemia LDL goal <70   5. Educated about COVID-19 virus infection    PLAN:    In order of problems listed above:  1. Exertional chest tightness with radiation to his neck that is relieved by rest.  It is predictable.  It has been worse as the weather has gotten colder.  It is new over the last 9 months.  CT angiogram performed earlier this year did not reveal significant obstruction.  FFR was not  performed because there was not enough plaque to identify a lesion worth modeling.  Under the circumstances, and this 73 year old with hyperlipidemia, further investigation would include a coronary arteriogram.  Perhaps his CT scan is missing a critical lesion.  We discussed this possibility.  I think it is unlikely.  However he is limited and has reproducible symptoms.  Therefore coronary angiography should be performed.  The procedure and risks were discussed with the patient.  He is willing to proceed. 2. No murmurs heard on exam 3. He has a written list of questions to answer that we covered. 4. LDL cholesterol is now less than 70.  Total cholesterol is 118 in September. 5. 3W's to avoid COVID-19 infection.  He does not want to have cath done until he receives a COVID-19 vaccine.  The patient was counseled to undergo left heart catheterization, coronary angiography, and possible percutaneous coronary intervention with stent implantation. The procedural risks and benefits were discussed in detail. The risks discussed included death, stroke, myocardial infarction, life-threatening bleeding, limb ischemia, kidney injury, allergy, and possible emergency cardiac surgery. The risk of these significant complications were estimated to occur less than 1% of the time. After discussion, the patient has agreed to proceed.    Medication Adjustments/Labs and Tests Ordered: Current medicines are reviewed at length with the patient today.  Concerns regarding medicines are outlined above.  No orders of the defined types were placed in this encounter.  No orders of the defined types were placed in this encounter.   Patient Instructions  Medication Instructions:  Your physician recommends that you continue on your current medications as directed. Please refer to the Current Medication list given to you today.  *If you need a refill on your cardiac medications before your next appointment, please call your  pharmacy*  Lab Work: You will need a BMET and CBC prior to cat  If you have labs (blood work) drawn today and your tests are completely normal, you will receive your results only by: Marland Kitchen  MyChart Message (if you have MyChart) OR . A paper copy in the mail If you have any lab test that is abnormal or we need to change your treatment, we will call you to review the results.  Testing/Procedures: Your physician has requested that you have a cardiac catheterization. Cardiac catheterization is used to diagnose and/or treat various heart conditions. Doctors may recommend this procedure for a number of different reasons. The most common reason is to evaluate chest pain. Chest pain can be a symptom of coronary artery disease (CAD), and cardiac catheterization can show whether plaque is narrowing or blocking your heart's arteries. This procedure is also used to evaluate the valves, as well as measure the blood flow and oxygen levels in different parts of your heart. For further information please visit HugeFiesta.tn. Please follow instruction sheet, as given.   Follow-Up: At [facility], you and your health needs are our priority.  As part of our continuing mission to provide you with exceptional heart care, we have created designated Provider Care Teams.  These Care Teams include your primary Cardiologist (physician) and Advanced Practice Providers (APPs -  Physician Assistants and Nurse Practitioners) who all work together to provide you with the care you need, when you need it.  Your next appointment:   2-3 week(s)  The format for your next appointment:   In Person  Provider:   You may see Dr. Daneen Schick or one of the following Advanced Practice Providers on your designated Care Team:    Truitt Merle, NP  Cecilie Kicks, NP  Kathyrn Drown, NP   Other Instructions      Signed, Sinclair Grooms, MD  12/26/2018 4:46 PM    [place]

## 2018-12-26 NOTE — Telephone Encounter (Signed)
Pt c/o of Chest Pain: STAT if CP now or developed within 24 hours  1. Are you having CP right now? No  2. Are you experiencing any other symptoms (ex. SOB, nausea, vomiting, sweating)? SOB  3. How long have you been experiencing CP? 3 months   4. Is your CP continuous or coming and going? Coming and going   5. Have you taken Nitroglycerin? No  Patient is calling stating he is still experiencing chest pain when he performs any activizes since his last visit. He feels it might be necessary for him to wear a heart monitor to determine what is going on. He is scheduled for an appointment regarding it today 12/26/18 at 3:40 PM. ?

## 2018-12-26 NOTE — Progress Notes (Signed)
Cardiology Office Note:    Date:  12/26/2018   ID:  Jay Ayers, DOB 1945/10/27, MRN [id number]  PCP:  Shon Baton, MD  Cardiologist:  No primary care provider on file.   Referring MD: Shon Baton, MD   Chief Complaint  Patient presents with  . Chest Pain    History of Present Illness:    Jay Ayers is a 73 y.o. male with a hx of vague chest discomfort, dyspnea, and ecent coronary CTA revealing plaque with less than 30% stenosis.  Overall the patient is concerned because he continues to have exertional chest tightness that radiates into his throat.  If he stops to rest the discomfort goes away.  It is been worse now that the weather has changed.  He does not have the discomfort at rest.  He also has occasional palpitations at other times.  Palpitations are not associated with discomfort.  Past Medical History:  Diagnosis Date  . Arthritis    knees, back , shoulders. hx. Polycythermia Rheumatica  . Clotting disorder ([place])    "tends to bleed easily"  . GERD (gastroesophageal reflux disease)   . Memory disturbance 08/06/2013   evaluated- memory appears to be worsening- no meds helped  . Mitral valve prolapse   . Neuromuscular disorder ([place])   . PMR (polymyalgia rheumatica) (HCC)   . Prostate cancer ([place])   . Weakness of back     Past Surgical History:  Procedure Laterality Date  . APPENDECTOMY  1958  . BACK SURGERY  1995  . COLONOSCOPY WITH PROPOFOL N/A 06/02/2015   Procedure: COLONOSCOPY WITH PROPOFOL;  Surgeon: Garlan Fair, MD;  Location: [facility];  Service: Endoscopy;  Laterality: N/A;  . EYE SURGERY     "cyst on eye excised"  . MINOR NAILBED REPAIR Right 04/25/2013   Procedure: RIGHT THUMB NAIL BIOPSY    (MINOR PROCEDURE);  Surgeon: Cammie Sickle., MD;  Location: [facility];  Service: Orthopedics;  Laterality: Right;  clean class  . PILONIDAL CYST EXCISION    . PROSTATE SURGERY  2005  . ROTATOR CUFF REPAIR Right 2002  . SPINE  SURGERY     only epidural injection in back after lumbar surgery several yrs ago  . TONSILLECTOMY  1964  . TRANSURETHRAL RESECTION OF PROSTATE  1998  . VASECTOMY  1991    Current Medications: Current Meds  Medication Sig  . acetaminophen (TYLENOL) 325 MG tablet Take 650 mg by mouth every 6 (six) hours as needed for mild pain or headache.  . Artificial Tear Ointment (DRY EYES OP) Apply 1-2 drops to eye daily as needed (FOR DRY EYES).  Marland Kitchen aspirin EC 81 MG tablet Take 1 tablet (81 mg total) by mouth daily.  Marland Kitchen ibuprofen (ADVIL) 200 MG tablet Take 200 mg by mouth every 6 (six) hours as needed for headache or moderate pain.  . meloxicam (MOBIC) 15 MG tablet Take 15 mg by mouth daily as needed for pain.   . Multiple Vitamin (MULTIVITAMIN) tablet Take 1 tablet by mouth daily.  . nitroGLYCERIN (NITROSTAT) 0.4 MG SL tablet Place 1 tablet (0.4 mg total) under the tongue every 5 (five) minutes as needed for chest pain.  . rosuvastatin (CRESTOR) 10 MG tablet Take 1 tablet (10 mg total) by mouth daily.     Allergies:   Nitrofurantoin   Social History   Socioeconomic History  . Marital status: Married    Spouse name: Not on file  . Number of children:  5  . Years of education: BS  . Highest education level: Not on file  Occupational History  . Occupation: Retired  Tobacco Use  . Smoking status: Never Smoker  . Smokeless tobacco: Never Used  Substance and Sexual Activity  . Alcohol use: Yes    Comment: occ  . Drug use: No  . Sexual activity: Not on file  Other Topics Concern  . Not on file  Social History Narrative   Lives   Patient is right handed.   Patient drinks 3 cups caffeine daily.   Social Determinants of Health   Financial Resource Strain:   . Difficulty of Paying Living Expenses: Not on file  Food Insecurity:   . Worried About Charity fundraiser in the Last Year: Not on file  . Ran Out of Food in the Last Year: Not on file  Transportation Needs:   . Lack of  Transportation (Medical): Not on file  . Lack of Transportation (Non-Medical): Not on file  Physical Activity:   . Days of Exercise per Week: Not on file  . Minutes of Exercise per Session: Not on file  Stress:   . Feeling of Stress : Not on file  Social Connections:   . Frequency of Communication with Friends and Family: Not on file  . Frequency of Social Gatherings with Friends and Family: Not on file  . Attends Religious Services: Not on file  . Active Member of Clubs or Organizations: Not on file  . Attends Archivist Meetings: Not on file  . Marital Status: Not on file     Family History: The patient's family history includes Emphysema in his father; Heart Problems in his brother and another family member; Heart failure in his mother; Leukemia in his brother; Multiple sclerosis in his brother and another family member; Prostate cancer in his brother and another family member; Stroke in his mother.  ROS:   Please see the history of present illness.    Decreased memory all other systems reviewed and are negative.  EKGs/Labs/Other Studies Reviewed:    The following studies were reviewed today:  2D Doppler echocardiogram September 2020 IMPRESSIONS   1. The left ventricle has normal systolic function, with an ejection fraction of 55-60%. The cavity size was normal. Left ventricular diastolic Doppler parameters are consistent with impaired relaxation. No evidence of left ventricular regional wall  motion abnormalities.  2. The right ventricle has normal systolic function. The cavity was normal. There is no increase in right ventricular wall thickness.  3. Mild mitral valve prolapse.  4. The mitral valve is myxomatous. Mild thickening of the mitral valve leaflet.  5. The aortic valve is tricuspid. Mild thickening of the aortic valve. Aortic valve regurgitation is mild by color flow Doppler.  6. The aorta is normal unless otherwise noted.  Coronary CTA with FFR August  2020:  IMPRESSION: 1. Coronary calcium score of 79. This was 2 percentile for age and sex matched control.  2. Normal coronary origin with right dominance.  3. Minimal non-obstructive CAD (0-24%). Consider non-atherosclerotic causes of chest pain. Consider preventive therapy and risk factor modification.     EKG:  EKG none  Recent Labs: 09/08/2018: B Natriuretic Peptide 72.2; BUN 10; Creatinine, Ser 1.07; Hemoglobin 15.0; Platelets 211; Potassium 4.7; Sodium 137 09/28/2018: ALT 16  Recent Lipid Panel    Component Value Date/Time   CHOL 118 09/28/2018 0901   TRIG 76 09/28/2018 0901   HDL 33 (L) 09/28/2018 0901  CHOLHDL 3.6 09/28/2018 0901   LDLCALC 70 09/28/2018 0901    Physical Exam:    VS:  BP 124/68   Pulse (!) 52   Ht 5\' 9"  (1.753 m)   Wt 155 lb 6.4 oz (70.5 kg)   SpO2 99%   BMI 22.95 kg/m     Wt Readings from Last 3 Encounters:  12/26/18 155 lb 6.4 oz (70.5 kg)  09/12/18 155 lb (70.3 kg)  09/08/18 150 lb (68 kg)     GEN: Slender. No acute distress HEENT: Normal NECK: No JVD. LYMPHATICS: No lymphadenopathy CARDIAC:  RRR without murmur, gallop, or edema. VASCULAR:  Normal Pulses. No bruits. RESPIRATORY:  Clear to auscultation without rales, wheezing or rhonchi  ABDOMEN: Soft, non-tender, non-distended, No pulsatile mass, MUSCULOSKELETAL: No deformity  SKIN: Warm and dry NEUROLOGIC:  Alert and oriented x 3 PSYCHIATRIC:  Normal affect   ASSESSMENT:    1. Chest pain, unspecified type   2. Mitral valve prolapse   3. Memory disturbance   4. Hyperlipidemia LDL goal <70   5. Educated about COVID-19 virus infection    PLAN:    In order of problems listed above:  1. Exertional chest tightness with radiation to his neck that is relieved by rest.  It is predictable.  It has been worse as the weather has gotten colder.  It is new over the last 9 months.  CT angiogram performed earlier this year did not reveal significant obstruction.  FFR was not  performed because there was not enough plaque to identify a lesion worth modeling.  Under the circumstances, and this 73 year old with hyperlipidemia, further investigation would include a coronary arteriogram.  Perhaps his CT scan is missing a critical lesion.  We discussed this possibility.  I think it is unlikely.  However he is limited and has reproducible symptoms.  Therefore coronary angiography should be performed.  The procedure and risks were discussed with the patient.  He is willing to proceed. 2. No murmurs heard on exam 3. He has a written list of questions to answer that we covered. 4. LDL cholesterol is now less than 70.  Total cholesterol is 118 in September. 5. 3W's to avoid COVID-19 infection.  He does not want to have cath done until he receives a COVID-19 vaccine.  The patient was counseled to undergo left heart catheterization, coronary angiography, and possible percutaneous coronary intervention with stent implantation. The procedural risks and benefits were discussed in detail. The risks discussed included death, stroke, myocardial infarction, life-threatening bleeding, limb ischemia, kidney injury, allergy, and possible emergency cardiac surgery. The risk of these significant complications were estimated to occur less than 1% of the time. After discussion, the patient has agreed to proceed.    Medication Adjustments/Labs and Tests Ordered: Current medicines are reviewed at length with the patient today.  Concerns regarding medicines are outlined above.  No orders of the defined types were placed in this encounter.  No orders of the defined types were placed in this encounter.   Patient Instructions  Medication Instructions:  Your physician recommends that you continue on your current medications as directed. Please refer to the Current Medication list given to you today.  *If you need a refill on your cardiac medications before your next appointment, please call your  pharmacy*  Lab Work: You will need a BMET and CBC prior to cat  If you have labs (blood work) drawn today and your tests are completely normal, you will receive your results only by: Marland Kitchen  MyChart Message (if you have MyChart) OR . A paper copy in the mail If you have any lab test that is abnormal or we need to change your treatment, we will call you to review the results.  Testing/Procedures: Your physician has requested that you have a cardiac catheterization. Cardiac catheterization is used to diagnose and/or treat various heart conditions. Doctors may recommend this procedure for a number of different reasons. The most common reason is to evaluate chest pain. Chest pain can be a symptom of coronary artery disease (CAD), and cardiac catheterization can show whether plaque is narrowing or blocking your heart's arteries. This procedure is also used to evaluate the valves, as well as measure the blood flow and oxygen levels in different parts of your heart. For further information please visit HugeFiesta.[email or web address]. Please follow instruction sheet, as given.   Follow-Up: At [facility], you and your health needs are our priority.  As part of our continuing mission to provide you with exceptional heart care, we have created designated Provider Care Teams.  These Care Teams include your primary Cardiologist (physician) and Advanced Practice Providers (APPs -  Physician Assistants and Nurse Practitioners) who all work together to provide you with the care you need, when you need it.  Your next appointment:   2-3 week(s)  The format for your next appointment:   In Person  Provider:   You may see Dr. Daneen Schick or one of the following Advanced Practice Providers on your designated Care Team:    Truitt Merle, NP  Cecilie Kicks, NP  Kathyrn Drown, NP   Other Instructions      Signed, Sinclair Grooms, MD  12/26/2018 4:46 PM    [place]

## 2018-12-26 NOTE — Telephone Encounter (Signed)
Dr. Tamala Julian ok with seeing pt today

## 2018-12-26 NOTE — Patient Instructions (Signed)
Medication Instructions:  Your physician recommends that you continue on your current medications as directed. Please refer to the Current Medication list given to you today.  *If you need a refill on your cardiac medications before your next appointment, please call your pharmacy*  Lab Work: You will need a BMET and CBC prior to cat  If you have labs (blood work) drawn today and your tests are completely normal, you will receive your results only by: Marland Kitchen MyChart Message (if you have MyChart) OR . A paper copy in the mail If you have any lab test that is abnormal or we need to change your treatment, we will call you to review the results.  Testing/Procedures: Your physician has requested that you have a cardiac catheterization. Cardiac catheterization is used to diagnose and/or treat various heart conditions. Doctors may recommend this procedure for a number of different reasons. The most common reason is to evaluate chest pain. Chest pain can be a symptom of coronary artery disease (CAD), and cardiac catheterization can show whether plaque is narrowing or blocking your heart's arteries. This procedure is also used to evaluate the valves, as well as measure the blood flow and oxygen levels in different parts of your heart. For further information please visit [facility]. Please follow instruction sheet, as given.   Follow-Up: At [facility], you and your health needs are our priority.  As part of our continuing mission to provide you with exceptional heart care, we have created designated Provider Care Teams.  These Care Teams include your primary Cardiologist (physician) and Advanced Practice Providers (APPs -  Physician Assistants and Nurse Practitioners) who all work together to provide you with the care you need, when you need it.  Your next appointment:   2-3 week(s)  The format for your next appointment:   In Person  Provider:   You may see Dr. Daneen Schick or one of the  following Advanced Practice Providers on your designated Care Team:    Truitt Merle, NP  Cecilie Kicks, NP  Kathyrn Drown, NP   Other Instructions

## 2019-01-08 ENCOUNTER — Telehealth: Payer: Self-pay | Admitting: Interventional Cardiology

## 2019-01-08 ENCOUNTER — Encounter: Payer: Self-pay | Admitting: *Deleted

## 2019-01-08 DIAGNOSIS — R079 Chest pain, unspecified: Secondary | ICD-10-CM

## 2019-01-08 NOTE — Telephone Encounter (Signed)
  Patient talked with Dr Tamala Julian at last visit about doing a catheterization and he would like to go ahead with the procedure. Please call to schedule.

## 2019-01-08 NOTE — Telephone Encounter (Signed)
Spoke with pt and went over cath instructions.  Pt will come for labs Thursday morning and then head over to [facility] for Covid screen.   Pt aware to quarantine after Covid screen until time of cath.  Pt will pick up a copy of his instructions when he comes for labs.  Advised to call the office if any questions prior to Monday.  Pt verbalized understanding and was in agreement with plan.

## 2019-01-08 NOTE — Telephone Encounter (Signed)
Spoke with pt and he would like to proceed with heart cath.  He prefers to complete on 1/11.  He was scheduled for covid screen tomorrow through the community line.  Switched pt to pre procedure testing to ensure we get results back in time.  Advised I will call and schedule cath and then call back to go over instructions.  Pt verbalized understanding and was appreciative for call.

## 2019-01-09 ENCOUNTER — Other Ambulatory Visit (HOSPITAL_COMMUNITY): Payer: PPO

## 2019-01-09 ENCOUNTER — Other Ambulatory Visit: Payer: PPO

## 2019-01-10 ENCOUNTER — Other Ambulatory Visit (HOSPITAL_COMMUNITY)
Admission: RE | Admit: 2019-01-10 | Discharge: 2019-01-10 | Disposition: A | Discharge status: Home / Self Care | Payer: PPO | Source: Ambulatory Visit | Attending: Interventional Cardiology | Admitting: Interventional Cardiology

## 2019-01-10 ENCOUNTER — Other Ambulatory Visit: Payer: Self-pay

## 2019-01-10 ENCOUNTER — Other Ambulatory Visit: Payer: PPO | Admitting: *Deleted

## 2019-01-10 ENCOUNTER — Telehealth: Payer: Self-pay | Admitting: *Deleted

## 2019-01-10 DIAGNOSIS — Z20822 Contact with and (suspected) exposure to covid-19: Secondary | ICD-10-CM | POA: Insufficient documentation

## 2019-01-10 DIAGNOSIS — Z01812 Encounter for preprocedural laboratory examination: Secondary | ICD-10-CM | POA: Diagnosis not present

## 2019-01-10 DIAGNOSIS — R079 Chest pain, unspecified: Secondary | ICD-10-CM | POA: Diagnosis not present

## 2019-01-10 LAB — BASIC METABOLIC PANEL
BUN/Creatinine Ratio: 14 (ref 10–24)
BUN: 14 mg/dL (ref 8–27)
CO2: 27 mmol/L (ref 20–29)
Calcium: 9.5 mg/dL (ref 8.6–10.2)
Chloride: 95 mmol/L — ABNORMAL LOW (ref 96–106)
Creatinine, Ser: 0.99 mg/dL (ref 0.76–1.27)
GFR calc Af Amer: 86 mL/min/{1.73_m2} (ref >59)
GFR calc non Af Amer: 75 mL/min/{1.73_m2} (ref >59)
Glucose: 87 mg/dL (ref 65–99)
Potassium: 4.7 mmol/L (ref 3.5–5.2)
Sodium: 133 mmol/L — ABNORMAL LOW (ref 134–144)

## 2019-01-10 LAB — CBC
Hematocrit: 46.2 % (ref 37.5–51.0)
Hemoglobin: 15.8 g/dL (ref 13.0–17.7)
MCH: 31.4 pg (ref 26.6–33.0)
MCHC: 34.2 g/dL (ref 31.5–35.7)
MCV: 92 fL (ref 79–97)
Platelets: 235 10*3/uL (ref 150–450)
RBC: 5.03 x10E6/uL (ref 4.14–5.80)
RDW: 12.1 % (ref 11.6–15.4)
WBC: 5.5 10*3/uL (ref 3.4–10.8)

## 2019-01-10 NOTE — Telephone Encounter (Signed)
Pt contacted pre-catheterization scheduled at [facility] for: Monday January 14, 2019 9 AM Verified arrival time and place: [facility]) at: 7 AM   No solid food after midnight prior to cath, clear liquids until 5 AM day of procedure. Contrast allergy: no   AM meds can be  taken pre-cath with sip of water including: ASA 81 mg   Confirmed patient has responsible adult to drive home post procedure and observe 24 hours after arriving home: yes  Currently, due to Covid-19 pandemic, only one support person will be allowed with patient. Must be the same support person for that patient's entire stay, will be screened and required to wear a mask. They will be asked to wait in the waiting room for the duration of the patient's stay.  Patients are required to wear a mask when they enter the hospital.      COVID-19 Pre-Screening Questions:  . In the past 7 to 10 days have you had a cough,  shortness of breath, headache, congestion, fever (100 or greater) body aches, chills, sore throat, or sudden loss of taste or sense of smell? no . Have you been around anyone with known Covid 19? no . Have you been around anyone who is awaiting Covid 19 test results in the past 7 to 10 days? no . Have you been around anyone who has been exposed to Covid 19, or has mentioned symptoms of Covid 19 within the past 7 to 10 days? no    I reviewed procedure/mask/visitor instructions, Covid-19 screening questions with patient, he verbalized understanding, thanked me for call.

## 2019-01-12 LAB — NOVEL CORONAVIRUS, NAA (HOSP ORDER, SEND-OUT TO REF LAB; TAT 18-24 HRS): SARS-CoV-2, NAA: NOT DETECTED

## 2019-01-14 ENCOUNTER — Telehealth: Payer: Self-pay | Admitting: Cardiology

## 2019-01-14 ENCOUNTER — Encounter (HOSPITAL_COMMUNITY)
Admission: RE | Disposition: A | Discharge status: Home / Self Care | Payer: Self-pay | Source: Home / Self Care | Attending: Interventional Cardiology

## 2019-01-14 ENCOUNTER — Ambulatory Visit (HOSPITAL_COMMUNITY)
Admission: RE | Admit: 2019-01-14 | Discharge: 2019-01-14 | Disposition: A | Discharge status: Home / Self Care | Payer: PPO | Attending: Interventional Cardiology | Admitting: Interventional Cardiology

## 2019-01-14 DIAGNOSIS — R0789 Other chest pain: Secondary | ICD-10-CM | POA: Insufficient documentation

## 2019-01-14 DIAGNOSIS — M1990 Unspecified osteoarthritis, unspecified site: Secondary | ICD-10-CM | POA: Insufficient documentation

## 2019-01-14 DIAGNOSIS — E785 Hyperlipidemia, unspecified: Secondary | ICD-10-CM | POA: Insufficient documentation

## 2019-01-14 DIAGNOSIS — R0600 Dyspnea, unspecified: Secondary | ICD-10-CM

## 2019-01-14 DIAGNOSIS — R0609 Other forms of dyspnea: Secondary | ICD-10-CM

## 2019-01-14 DIAGNOSIS — Z7982 Long term (current) use of aspirin: Secondary | ICD-10-CM | POA: Insufficient documentation

## 2019-01-14 DIAGNOSIS — I080 Rheumatic disorders of both mitral and aortic valves: Secondary | ICD-10-CM | POA: Diagnosis not present

## 2019-01-14 DIAGNOSIS — Z881 Allergy status to other antibiotic agents status: Secondary | ICD-10-CM | POA: Insufficient documentation

## 2019-01-14 DIAGNOSIS — R079 Chest pain, unspecified: Secondary | ICD-10-CM | POA: Diagnosis present

## 2019-01-14 DIAGNOSIS — R413 Other amnesia: Secondary | ICD-10-CM | POA: Diagnosis present

## 2019-01-14 DIAGNOSIS — Z79899 Other long term (current) drug therapy: Secondary | ICD-10-CM | POA: Diagnosis not present

## 2019-01-14 DIAGNOSIS — Z8546 Personal history of malignant neoplasm of prostate: Secondary | ICD-10-CM | POA: Insufficient documentation

## 2019-01-14 DIAGNOSIS — I208 Other forms of angina pectoris: Secondary | ICD-10-CM | POA: Diagnosis not present

## 2019-01-14 DIAGNOSIS — M353 Polymyalgia rheumatica: Secondary | ICD-10-CM | POA: Insufficient documentation

## 2019-01-14 DIAGNOSIS — K219 Gastro-esophageal reflux disease without esophagitis: Secondary | ICD-10-CM | POA: Diagnosis not present

## 2019-01-14 HISTORY — PX: LEFT HEART CATH AND CORONARY ANGIOGRAPHY: CATH118249

## 2019-01-14 SURGERY — LEFT HEART CATH AND CORONARY ANGIOGRAPHY
Anesthesia: LOCAL

## 2019-01-14 MED ORDER — MIDAZOLAM HCL 2 MG/2ML IJ SOLN
INTRAMUSCULAR | Status: DC | PRN
  Administered 2019-01-14 (×2): 1 mg via INTRAVENOUS

## 2019-01-14 MED ORDER — IOHEXOL 350 MG/ML SOLN
INTRAVENOUS | Status: DC | PRN
  Administered 2019-01-14: 25 mL

## 2019-01-14 MED ORDER — MIDAZOLAM HCL 2 MG/2ML IJ SOLN
INTRAMUSCULAR | Status: AC
  Filled 2019-01-14: qty 2

## 2019-01-14 MED ORDER — ACETAMINOPHEN 325 MG PO TABS: 650.0000 mg | ORAL_TABLET | ORAL | Status: DC | PRN

## 2019-01-14 MED ORDER — LABETALOL HCL 5 MG/ML IV SOLN: 10.0000 mg | INTRAVENOUS | Status: DC | PRN

## 2019-01-14 MED ORDER — ONDANSETRON HCL 4 MG/2ML IJ SOLN: 4.0000 mg | Freq: Four times a day (QID) | INTRAMUSCULAR | Status: DC | PRN

## 2019-01-14 MED ORDER — VERAPAMIL HCL 2.5 MG/ML IV SOLN
INTRAVENOUS | Status: DC | PRN
  Administered 2019-01-14: 10 mL via INTRA_ARTERIAL

## 2019-01-14 MED ORDER — SODIUM CHLORIDE 0.9% FLUSH: 3.0000 mL | INTRAVENOUS | Status: DC | PRN

## 2019-01-14 MED ORDER — SODIUM CHLORIDE 0.9% FLUSH: 3.0000 mL | Freq: Two times a day (BID) | INTRAVENOUS | Status: DC

## 2019-01-14 MED ORDER — SODIUM CHLORIDE 0.9 % IV SOLN: INTRAVENOUS | Status: DC

## 2019-01-14 MED ORDER — HEPARIN SODIUM (PORCINE) 1000 UNIT/ML IJ SOLN
INTRAMUSCULAR | Status: AC
  Filled 2019-01-14: qty 1

## 2019-01-14 MED ORDER — FENTANYL CITRATE (PF) 100 MCG/2ML IJ SOLN
INTRAMUSCULAR | Status: DC | PRN
  Administered 2019-01-14: 25 ug via INTRAVENOUS

## 2019-01-14 MED ORDER — SODIUM CHLORIDE 0.9 % IV SOLN: 250.0000 mL | INTRAVENOUS | Status: DC | PRN

## 2019-01-14 MED ORDER — HEPARIN (PORCINE) IN NACL 1000-0.9 UT/500ML-% IV SOLN
INTRAVENOUS | Status: AC
  Filled 2019-01-14: qty 1000

## 2019-01-14 MED ORDER — HEPARIN SODIUM (PORCINE) 1000 UNIT/ML IJ SOLN
INTRAMUSCULAR | Status: DC | PRN
  Administered 2019-01-14: 3500 [IU] via INTRAVENOUS

## 2019-01-14 MED ORDER — SODIUM CHLORIDE 0.9 % WEIGHT BASED INFUSION: 1.0000 mL/kg/h | INTRAVENOUS | Status: DC

## 2019-01-14 MED ORDER — LIDOCAINE HCL (PF) 1 % IJ SOLN
INTRAMUSCULAR | Status: DC | PRN
  Administered 2019-01-14: 2 mL

## 2019-01-14 MED ORDER — HEPARIN (PORCINE) IN NACL 1000-0.9 UT/500ML-% IV SOLN
INTRAVENOUS | Status: DC | PRN
  Administered 2019-01-14 (×2): 500 mL

## 2019-01-14 MED ORDER — VERAPAMIL HCL 2.5 MG/ML IV SOLN
INTRAVENOUS | Status: AC
  Filled 2019-01-14: qty 2

## 2019-01-14 MED ORDER — ASPIRIN 81 MG PO CHEW: 81.0000 mg | CHEWABLE_TABLET | ORAL | Status: DC

## 2019-01-14 MED ORDER — SODIUM CHLORIDE 0.9 % WEIGHT BASED INFUSION
3.0000 mL/kg/h | INTRAVENOUS | Status: AC
  Administered 2019-01-14: 3 mL/kg/h via INTRAVENOUS

## 2019-01-14 MED ORDER — FENTANYL CITRATE (PF) 100 MCG/2ML IJ SOLN
INTRAMUSCULAR | Status: AC
  Filled 2019-01-14: qty 2

## 2019-01-14 MED ORDER — LIDOCAINE HCL (PF) 1 % IJ SOLN
INTRAMUSCULAR | Status: AC
  Filled 2019-01-14: qty 30

## 2019-01-14 MED ORDER — HYDRALAZINE HCL 20 MG/ML IJ SOLN: 10.0000 mg | INTRAMUSCULAR | Status: DC | PRN

## 2019-01-14 MED ORDER — OXYCODONE HCL 5 MG PO TABS: 5.0000 mg | ORAL_TABLET | ORAL | Status: DC | PRN

## 2019-01-14 SURGICAL SUPPLY — 11 items
CATH 5FR JL3.5 JR4 ANG PIG MP (CATHETERS) ×2 IMPLANT
DEVICE RAD COMP TR BAND LRG (VASCULAR PRODUCTS) ×2 IMPLANT
GLIDESHEATH SLEND A-KIT 6F 22G (SHEATH) ×2 IMPLANT
GUIDEWIRE INQWIRE 1.5J.035X260 (WIRE) ×1 IMPLANT
INQWIRE 1.5J .035X260CM (WIRE) ×2
KIT HEART LEFT (KITS) ×2 IMPLANT
PACK CARDIAC CATHETERIZATION (CUSTOM PROCEDURE TRAY) ×2 IMPLANT
PROTECTION STATION PRESSURIZED (MISCELLANEOUS) ×2
STATION PROTECTION PRESSURIZED (MISCELLANEOUS) ×1 IMPLANT
TRANSDUCER W/STOPCOCK (MISCELLANEOUS) ×2 IMPLANT
TUBING CIL FLEX 10 FLL-RA (TUBING) ×2 IMPLANT

## 2019-01-14 NOTE — Telephone Encounter (Signed)
Pt called cath lab with some numbness/tingling in Rt 5 th finger.  Color same as lt hand, no coolness in finger. Feels sensation to touch.  Most likely nerve irritation.  Reviewed with Dr. Tamala Julian and he agreed.  Tried to call pt back twice with busy single.

## 2019-01-14 NOTE — Interval H&P Note (Signed)
Cath Lab Visit (complete for each Cath Lab visit)  Clinical Evaluation Leading to the Procedure:   ACS: No.  Non-ACS:    Anginal Classification: CCS II  Anti-ischemic medical therapy: Minimal Therapy (1 class of medications)  Non-Invasive Test Results: Low-risk stress test findings: cardiac mortality <1%/year  Prior CABG: No previous CABG      History and Physical Interval Note:  01/14/2019 8:30 AM  Jay Ayers  has presented today for surgery, with the diagnosis of angina.  The various methods of treatment have been discussed with the patient and family. After consideration of risks, benefits and other options for treatment, the patient has consented to  Procedure(s): LEFT HEART CATH AND CORONARY ANGIOGRAPHY (N/A) as a surgical intervention.  The patient's history has been reviewed, patient examined, no change in status, stable for surgery.  I have reviewed the patient's chart and labs.  Questions were answered to the patient's satisfaction.     Belva Crome III

## 2019-01-14 NOTE — CV Procedure (Signed)
   Left heart cath with coronary angiography via right radial using real-time vascular ultrasound for access.  Widely patent coronary arteries with only minimal luminal irregularities noted in the LAD and right coronary.  The mid LAD has 2 "hinge points" of systolic compression that are not obstructive.  Mild elevation in left ventricular end-diastolic pressure, 21 mmHg.  EF 55%.  Chest /throat exertional discomfort and dyspnea on exertion of uncertain etiology.  Remote possibility that the patient has microvascular disease.  Nonobstructive mid LAD systolic compression also present.  Elevated LVEDP also invokes the possibility of a component of diastolic heart failure.  Recommend continued risk factor modification, lifestyle changes including continued exercise modified to avoid severe symptoms.  Consider pulmonary source of dyspnea.

## 2019-01-14 NOTE — Discharge Instructions (Signed)
Radial Site Care  This sheet gives you information about how to care for yourself after your procedure. Your health care provider may also give you more specific instructions. If you have problems or questions, contact your health care provider. What can I expect after the procedure? After the procedure, it is common to have:  Bruising and tenderness at the catheter insertion area. Follow these instructions at home: Medicines  Take over-the-counter and prescription medicines only as told by your health care provider. Insertion site care  Follow instructions from your health care provider about how to take care of your insertion site. Make sure you: ? Wash your hands with soap and water before you change your bandage (dressing). If soap and water are not available, use hand sanitizer. ? Change your dressing as told by your health care provider. ? Leave stitches (sutures), skin glue, or adhesive strips in place. These skin closures may need to stay in place for 2 weeks or longer. If adhesive strip edges start to loosen and curl up, you may trim the loose edges. Do not remove adhesive strips completely unless your health care provider tells you to do that.  Check your insertion site every day for signs of infection. Check for: ? Redness, swelling, or pain. ? Fluid or blood. ? Pus or a bad smell. ? Warmth.  Do not take baths, swim, or use a hot tub until your health care provider approves.  You may shower 24-48 hours after the procedure, or as directed by your health care provider. ? Remove the dressing and gently wash the site with plain soap and water. ? Pat the area dry with a clean towel. ? Do not rub the site. That could cause bleeding.  Do not apply powder or lotion to the site. Activity   For 24 hours after the procedure, or as directed by your health care provider: ? Do not flex or bend the affected arm. ? Do not push or pull heavy objects with the affected arm. ? Do not  drive yourself home from the hospital or clinic. You may drive 24 hours after the procedure unless your health care provider tells you not to. ? Do not operate machinery or power tools.  Do not lift anything that is heavier than 10 lb (4.5 kg), or the limit that you are told, until your health care provider says that it is safe.  Ask your health care provider when it is okay to: ? Return to work or school. ? Resume usual physical activities or sports. ? Resume sexual activity. General instructions  If the catheter site starts to bleed, raise your arm and put firm pressure on the site. If the bleeding does not stop, get help right away. This is a medical emergency.  If you went home on the same day as your procedure, a responsible adult should be with you for the first 24 hours after you arrive home.  Keep all follow-up visits as told by your health care provider. This is important. Contact a health care provider if:  You have a fever.  You have redness, swelling, or yellow drainage around your insertion site. Get help right away if:  You have unusual pain at the radial site.  The catheter insertion area swells very fast.  The insertion area is bleeding, and the bleeding does not stop when you hold steady pressure on the area.  Your arm or hand becomes pale, cool, tingly, or numb. These symptoms may represent a serious problem   that is an emergency. Do not wait to see if the symptoms will go away. Get medical help right away. Call your local emergency services (911 in the [place] Do not drive yourself to the hospital. Summary  After the procedure, it is common to have bruising and tenderness at the site.  Follow instructions from your health care provider about how to take care of your radial site wound. Check the wound every day for signs of infection.  Do not lift anything that is heavier than 10 lb (4.5 kg), or the limit that you are told, until your health care provider says  that it is safe. This information is not intended to replace advice given to you by your health care provider. Make sure you discuss any questions you have with your health care provider. Document Revised: 01/25/2017 Document Reviewed: 01/25/2017 Elsevier Patient Education  2020 [employer]  

## 2019-01-15 ENCOUNTER — Telehealth: Payer: Self-pay | Admitting: *Deleted

## 2019-01-15 NOTE — Telephone Encounter (Signed)
Spoke with Dr. Tamala Julian and he wants to see pt back in 4-8 weeks.  Pt needs to be active and report back symptoms.  Spoke with pt and made him aware of recommendations.  Scheduled pt for f/u on 2/25 with Dr. Tamala Julian.  Pt appreciative for call.

## 2019-01-15 NOTE — Telephone Encounter (Signed)
I called patient to arrange post cath follow up appointment with PA/NP. Pt states he would prefer to discuss cath findings and recommendations with Dr Tamala Julian over the telephone before scheduling appt with PA/NP. Pt prefers that Dr Tamala Julian call him at home phone number rather than mobile phone number.  Pt advised I will forward to Dr Tamala Julian for review and follow up.

## 2019-01-23 ENCOUNTER — Telehealth: Payer: Self-pay | Admitting: Interventional Cardiology

## 2019-01-23 NOTE — Telephone Encounter (Signed)
The symptoms are vague and not related to the procedure.  Starting a medication with the current complaints with potentially make things worse if side effects occur. Watchful waiting. Study was very reassuring without any serious findings.

## 2019-01-23 NOTE — Telephone Encounter (Signed)
Pt had cath on 1/11.  Since 1/18 he has noticed cramping in his right bicep (same arm cath was put in).  Also since Monday, pt has been feeling fatigued, lightheaded, gets winded easily (walking down driveway to get newspaper, and intermittent chest pain. No vitals available.  Pt states he does feel his pulse occasionally in his carotid artery and feels skipped beats.  Pt mentioned that Dr. Tamala Julian said something to his wife about possibly starting some new meds.  Advised I will send to Dr. Tamala Julian for review.

## 2019-01-23 NOTE — Telephone Encounter (Signed)
Patient calling stating he had a heart cath 1/11 and is experiencing cramping and pain in the arm it was done. He states he is also still getting very exhausted easily. He would like a call back discussing if he should be seen sooner.

## 2019-01-24 NOTE — Telephone Encounter (Signed)
Spoke with pt and made him aware of information from Dr. Tamala Julian.  Pt verbalized understanding and was appreciative for call.

## 2019-01-28 ENCOUNTER — Ambulatory Visit: Payer: PPO | Admitting: Nurse Practitioner

## 2019-02-11 ENCOUNTER — Other Ambulatory Visit: Payer: Self-pay

## 2019-02-11 ENCOUNTER — Ambulatory Visit: Payer: PPO | Admitting: Physician Assistant

## 2019-02-11 ENCOUNTER — Telehealth: Payer: Self-pay | Admitting: Interventional Cardiology

## 2019-02-11 ENCOUNTER — Encounter: Payer: Self-pay | Admitting: Physician Assistant

## 2019-02-11 VITALS — BP 106/56 | HR 52 | Ht 69.0 in | Wt 160.2 lb

## 2019-02-11 DIAGNOSIS — R079 Chest pain, unspecified: Secondary | ICD-10-CM | POA: Diagnosis not present

## 2019-02-11 DIAGNOSIS — I5032 Chronic diastolic (congestive) heart failure: Secondary | ICD-10-CM | POA: Diagnosis not present

## 2019-02-11 DIAGNOSIS — R0600 Dyspnea, unspecified: Secondary | ICD-10-CM | POA: Diagnosis not present

## 2019-02-11 DIAGNOSIS — R0609 Other forms of dyspnea: Secondary | ICD-10-CM

## 2019-02-11 DIAGNOSIS — I341 Nonrheumatic mitral (valve) prolapse: Secondary | ICD-10-CM | POA: Diagnosis not present

## 2019-02-11 MED ORDER — LOSARTAN POTASSIUM 25 MG PO TABS: 25.0000 mg | ORAL_TABLET | Freq: Every day | ORAL | 3 refills | Status: DC

## 2019-02-11 NOTE — Telephone Encounter (Signed)
Spoke with pt and he continues to have chest pressure and SOB.  Was in the yard working Saturday and became very winded and had pain shoot into his left arm.  Pt with recent cath.  Dr. Tamala Julian mentioned in cath note possibly consider adding low dose ARB or BB.  Scheduled pt to come in today at 3:45pm to see Vin Bhagat, PA-C.

## 2019-02-11 NOTE — Progress Notes (Signed)
Cardiology Office Note    Date:  02/11/2019   ID:  Jay Ayers, DOB [birth year], MRN [id number]  PCP:  Shon Baton, MD  Cardiologist:  Dr. Tamala Julian  Chief Complaint: CP and SOB  History of Present Illness:   Jay Ayers is a 74 y.o. male presents for chest pain and SOB.  Hx of vague chest discomfort and dyspnea.  Coronary CTA revealed plaque with less than 30% stenosis 08/2018. Echo 09/2018 showed normal LVEF of 55-60% and grade 1 DD. Due to ongoing symptoms he underwent cath by Dr. Tamala Julian 01/14/19 revealing essentially normal coronaries (2 focal area of mid LAD - less than 25%) that would explain symptoms. LVEDP 21 mmHg.  EF 55%.  Findings consistent with chronic diastolic dysfunction/heart failure.  He continues to have intermittent chest pain and shortness of breath.  Had an episode over the weekend by doing yard work.  This morning he had a recurrent episode while walking.  Resolved with rest within few minutes.  He has not tried sublingual nitroglycerin.  No palpitation, orthopnea, PND, syncope, lower extremity edema or melena.   Past Medical History:  Diagnosis Date  . Arthritis    knees, back , shoulders. hx. Polycythermia Rheumatica  . Clotting disorder ([place])    "tends to bleed easily"  . GERD (gastroesophageal reflux disease)   . Memory disturbance 08/06/2013   evaluated- memory appears to be worsening- no meds helped  . Mitral valve prolapse   . Neuromuscular disorder ([place])   . PMR (polymyalgia rheumatica) (HCC)   . Prostate cancer ([place])   . Weakness of back     Past Surgical History:  Procedure Laterality Date  . APPENDECTOMY  1958  . BACK SURGERY  1995  . COLONOSCOPY WITH PROPOFOL N/A 06/02/2015   Procedure: COLONOSCOPY WITH PROPOFOL;  Surgeon: Garlan Fair, MD;  Location: [facility];  Service: Endoscopy;  Laterality: N/A;  . EYE SURGERY     "cyst on eye excised"  . LEFT HEART CATH AND CORONARY ANGIOGRAPHY N/A 01/14/2019   Procedure: LEFT HEART CATH AND  CORONARY ANGIOGRAPHY;  Surgeon: Belva Crome, MD;  Location: [facility];  Service: Cardiovascular;  Laterality: N/A;  . MINOR NAILBED REPAIR Right 04/25/2013   Procedure: RIGHT THUMB NAIL BIOPSY    (MINOR PROCEDURE);  Surgeon: Cammie Sickle., MD;  Location: [facility];  Service: Orthopedics;  Laterality: Right;  clean class  . PILONIDAL CYST EXCISION    . PROSTATE SURGERY  2005  . ROTATOR CUFF REPAIR Right 2002  . SPINE SURGERY     only epidural injection in back after lumbar surgery several yrs ago  . TONSILLECTOMY  1964  . TRANSURETHRAL RESECTION OF PROSTATE  1998  . VASECTOMY  1991    Current Medications: Prior to Admission medications   Medication Sig Start Date End Date Taking? Authorizing Provider  acetaminophen (TYLENOL) 500 MG tablet Take 500 mg by mouth at bedtime as needed for headache.    [provider]  alum & mag hydroxide-simeth (MAALOX/MYLANTA) 200-200-20 MG/5ML suspension Take 15 mLs by mouth daily as needed for indigestion or heartburn.    [provider]  ARTIFICIAL TEAR SOLUTION OP Place 1 drop into both eyes daily as needed (dry eyes).    [provider]  aspirin EC 81 MG tablet Take 1 tablet (81 mg total) by mouth daily. Patient taking differently: Take 81 mg by mouth every evening.  08/17/18   Belva Crome, MD  meloxicam (MOBIC) 15 MG tablet Take 15 mg by mouth daily as needed for pain.     [provider]  Multiple Vitamin (MULTIVITAMIN) tablet Take 1 tablet by mouth daily.    [provider]  nitroGLYCERIN (NITROSTAT) 0.4 MG SL tablet Place 1 tablet (0.4 mg total) under the tongue every 5 (five) minutes as needed for chest pain. 08/17/18 01/08/19  Belva Crome, MD  rosuvastatin (CRESTOR) 10 MG tablet Take 1 tablet (10 mg total) by mouth daily. Patient taking differently: Take 10 mg by mouth every evening.  08/17/18 01/08/19  Belva Crome, MD    Allergies:   Nitrofurantoin   Social History     Socioeconomic History  . Marital status: Married    Spouse name: Not on file  . Number of children: 5  . Years of education: BS  . Highest education level: Not on file  Occupational History  . Occupation: Retired  Tobacco Use  . Smoking status: Never Smoker  . Smokeless tobacco: Never Used  Substance and Sexual Activity  . Alcohol use: Yes    Comment: occ  . Drug use: No  . Sexual activity: Not on file  Other Topics Concern  . Not on file  Social History Narrative   Lives   Patient is right handed.   Patient drinks 3 cups caffeine daily.   Social Determinants of Health   Financial Resource Strain:   . Difficulty of Paying Living Expenses: Not on file  Food Insecurity:   . Worried About Charity fundraiser in the Last Year: Not on file  . Ran Out of Food in the Last Year: Not on file  Transportation Needs:   . Lack of Transportation (Medical): Not on file  . Lack of Transportation (Non-Medical): Not on file  Physical Activity:   . Days of Exercise per Week: Not on file  . Minutes of Exercise per Session: Not on file  Stress:   . Feeling of Stress : Not on file  Social Connections:   . Frequency of Communication with Friends and Family: Not on file  . Frequency of Social Gatherings with Friends and Family: Not on file  . Attends Religious Services: Not on file  . Active Member of Clubs or Organizations: Not on file  . Attends Archivist Meetings: Not on file  . Marital Status: Not on file     Family History:  The patient's family history includes Emphysema in his father; Heart Problems in his brother and another family member; Heart failure in his mother; Leukemia in his brother; Multiple sclerosis in his brother and another family member; Prostate cancer in his brother and another family member; Stroke in his mother.   ROS:   Please see the history of present illness.    ROS All other systems reviewed and are negative.   PHYSICAL EXAM:   VS:  BP (!)  106/56   Pulse (!) 52   Ht 5\' 9"  (1.753 m)   Wt 160 lb 3.2 oz (72.7 kg)   SpO2 99%   BMI 23.66 kg/m    GEN: Well nourished, well developed, in no acute distress  HEENT: normal  Neck: no JVD, carotid bruits, or masses Cardiac: RRR; no murmurs, rubs, or gallops,no edema  Respiratory:  clear to auscultation bilaterally, normal work of breathing GI: soft, nontender, nondistended, + BS MS: no deformity or atrophy  Skin: warm and dry, no rash Neuro:  Alert and Oriented x 3, Strength and  sensation are intact Psych: euthymic mood, full affect  Wt Readings from Last 3 Encounters:  02/11/19 160 lb 3.2 oz (72.7 kg)  01/14/19 150 lb (68 kg)  12/26/18 155 lb 6.4 oz (70.5 kg)      Studies/Labs Reviewed:   EKG:  EKG is not ordered today.    Recent Labs: 09/08/2018: B Natriuretic Peptide 72.2 09/28/2018: ALT 16 01/10/2019: BUN 14; Creatinine, Ser 0.99; Hemoglobin 15.8; Platelets 235; Potassium 4.7; Sodium 133   Lipid Panel    Component Value Date/Time   CHOL 118 09/28/2018 0901   TRIG 76 09/28/2018 0901   HDL 33 (L) 09/28/2018 0901   CHOLHDL 3.6 09/28/2018 0901   LDLCALC 70 09/28/2018 0901    Additional studies/ records that were reviewed today include:   Echocardiogram: 09/2018 1. The left ventricle has normal systolic function, with an ejection  fraction of 55-60%. The cavity size was normal. Left ventricular diastolic  Doppler parameters are consistent with impaired relaxation. No evidence of  left ventricular regional wall  motion abnormalities.  2. The right ventricle has normal systolic function. The cavity was  normal. There is no increase in right ventricular wall thickness.  3. Mild mitral valve prolapse.  4. The mitral valve is myxomatous. Mild thickening of the mitral valve  leaflet.  5. The aortic valve is tricuspid. Mild thickening of the aortic valve.  Aortic valve regurgitation is mild by color flow Doppler.  6. The aorta is normal unless otherwise noted.    LEFT HEART CATH AND CORONARY ANGIOGRAPHY 01/2019  Conclusion   Normal left main  Widely patent LAD with 2 focal areas of mid LAD systolic compression to less than 25%.  Normal circumflex  Mild diffuse disease in RCA without obstructive disease.  LVEDP 21 mmHg.  EF 55%.  Findings consistent with chronic diastolic dysfunction/heart failure.  RECOMMENDATIONS:   No obstructive disease to account for patient's exertional symptoms.  There is always the possibility of microvascular disease.  Diastolic dysfunction documented by the study.  Potential therapeutic considerations would be the addition of low-dose ARB therapy and or beta-blocker therapy.   Diagnostic    ASSESSMENT & PLAN:    1. Chest pain/shortness of breath/diastolic dysfunction Reviewed prior work-up as summarized above.  Cath with essentially normal coronaries.  He did have diastolic dysfunction with elevated LVEDP.  Mild mitral valve prolapse.  His symptoms out of proportion for finding.  He has baseline bradycardia which precludes addition of beta-blocker.  Will add low-dose losartan.  Check kidney function at follow-up.  His blood pressure runs in 120s to 130s/60s at home.  Repeat check by myself 122/66.  I have strongly encouraged trial of sublingual nitroglycerin with next episode. I have spent more than 20 minutes just for patient educations, discussing findings and plan.    Medication Adjustments/Labs and Tests Ordered: Current medicines are reviewed at length with the patient today.  Concerns regarding medicines are outlined above.  Medication changes, Labs and Tests ordered today are listed in the Patient Instructions below. Patient Instructions  Medication Instructions:   Your physician has recommended you make the following change in your medication:   1) Start Losartan 25 mg, 1 tablet by mouth once a day  *If you need a refill on your cardiac medications before your next appointment, please call your  pharmacy*  Lab Work:  None ordered today  If you have labs (blood work) drawn today and your tests are completely normal, you will receive your results only by: Marland Kitchen MyChart  Message (if you have MyChart) OR . A paper copy in the mail If you have any lab test that is abnormal or we need to change your treatment, we will call you to review the results.  Testing/Procedures:  None ordered today  Follow-Up: At [facility], you and your health needs are our priority.  As part of our continuing mission to provide you with exceptional heart care, we have created designated Provider Care Teams.  These Care Teams include your primary Cardiologist (physician) and Advanced Practice Providers (APPs -  Physician Assistants and Nurse Practitioners) who all work together to provide you with the care you need, when you need it.  Your next appointment:    Keep follow up with Dr. Tamala Julian as scheduled.     Jarrett Soho, [place]  02/11/2019 4:02 PM    [facility] [address], [place], [place]  [address] Phone: [phone]; Fax: [phone]

## 2019-02-11 NOTE — Patient Instructions (Signed)
Medication Instructions:   Your physician has recommended you make the following change in your medication:   1) Start Losartan 25 mg, 1 tablet by mouth once a day  *If you need a refill on your cardiac medications before your next appointment, please call your pharmacy*  Lab Work:  None ordered today  If you have labs (blood work) drawn today and your tests are completely normal, you will receive your results only by: Marland Kitchen MyChart Message (if you have MyChart) OR . A paper copy in the mail If you have any lab test that is abnormal or we need to change your treatment, we will call you to review the results.  Testing/Procedures:  None ordered today  Follow-Up: At [facility], you and your health needs are our priority.  As part of our continuing mission to provide you with exceptional heart care, we have created designated Provider Care Teams.  These Care Teams include your primary Cardiologist (physician) and Advanced Practice Providers (APPs -  Physician Assistants and Nurse Practitioners) who all work together to provide you with the care you need, when you need it.  Your next appointment:    Keep follow up with Dr. Tamala Julian as scheduled.

## 2019-02-11 NOTE — Telephone Encounter (Signed)
Pt c/o Shortness Of Breath: STAT if SOB developed within the last 24 hours or pt is noticeably SOB on the phone  1. Are you currently SOB (can you hear that pt is SOB on the phone)? Yes, cannot hear SOB over the phone   2. How long have you been experiencing SOB? Has worsened since 02/10/19  3. Are you SOB when sitting or when up moving around? Moving around   4. Are you currently experiencing any other symptoms? Pressure in chest & discomfort in left arm   Patient is calling stating he has been experiencing SOB, pressure in chest, and discomfort in left arm. He states he would like Jay Ayers to give him a callback in regards to this and would like to know if a sooner appointment is necessary due to his symptoms worsening.

## 2019-02-27 NOTE — Progress Notes (Signed)
Cardiology Office Note:    Date:  02/28/2019   ID:  Jay Ayers, DOB [birth year], MRN [id number]  PCP:  Shon Baton, MD  Cardiologist:  Sinclair Grooms, MD   Referring MD: Shon Baton, MD   Chief Complaint  Patient presents with  . Advice Only  . Palpitations  . Hyperlipidemia    History of Present Illness:    Jay Ayers is a 74 y.o. male with a hx of vague chest discomfort, dyspnea, LVEF of 55-60% and grade 1 DD, cath 01/14/19 revealing essentially normal coronaries, LVEDP 21 mmHg. and EF 55%.   [name] continues to have various concerns.  There is more dyspnea than he feels he should have with activity that he has been able to do his entire life.  He has palpitations intermittently, which are relatively new.  He denies orthopnea, PND, swelling, syncope, exertional related chest pain.  Still having episodes of chest pain that are migratory and nonexertion related.  Losartan was started on the last visit to treat the possibility of diastolic dysfunction contributing to dyspnea given elevated left ventricular end-diastolic pressure.  He will also give risk protection going forward.  Past Medical History:  Diagnosis Date  . Arthritis    knees, back , shoulders. hx. Polycythermia Rheumatica  . Clotting disorder ([place])    "tends to bleed easily"  . GERD (gastroesophageal reflux disease)   . Memory disturbance 08/06/2013   evaluated- memory appears to be worsening- no meds helped  . Mitral valve prolapse   . Neuromuscular disorder ([place])   . PMR (polymyalgia rheumatica) (HCC)   . Prostate cancer ([place])   . Weakness of back     Past Surgical History:  Procedure Laterality Date  . APPENDECTOMY  1958  . BACK SURGERY  1995  . COLONOSCOPY WITH PROPOFOL N/A 06/02/2015   Procedure: COLONOSCOPY WITH PROPOFOL;  Surgeon: Garlan Fair, MD;  Location: [facility];  Service: Endoscopy;  Laterality: N/A;  . EYE SURGERY     "cyst on eye excised"  . LEFT HEART CATH AND CORONARY  ANGIOGRAPHY N/A 01/14/2019   Procedure: LEFT HEART CATH AND CORONARY ANGIOGRAPHY;  Surgeon: Belva Crome, MD;  Location: [facility];  Service: Cardiovascular;  Laterality: N/A;  . MINOR NAILBED REPAIR Right 04/25/2013   Procedure: RIGHT THUMB NAIL BIOPSY    (MINOR PROCEDURE);  Surgeon: Cammie Sickle., MD;  Location: [facility];  Service: Orthopedics;  Laterality: Right;  clean class  . PILONIDAL CYST EXCISION    . PROSTATE SURGERY  2005  . ROTATOR CUFF REPAIR Right 2002  . SPINE SURGERY     only epidural injection in back after lumbar surgery several yrs ago  . TONSILLECTOMY  1964  . TRANSURETHRAL RESECTION OF PROSTATE  1998  . VASECTOMY  1991    Current Medications: Current Meds  Medication Sig  . acetaminophen (TYLENOL) 500 MG tablet Take 500 mg by mouth at bedtime as needed for headache.  Marland Kitchen alum & mag hydroxide-simeth (MAALOX/MYLANTA) 200-200-20 MG/5ML suspension Take 15 mLs by mouth daily as needed for indigestion or heartburn.  . ARTIFICIAL TEAR SOLUTION OP Place 1 drop into both eyes daily as needed (dry eyes).  Marland Kitchen aspirin EC 81 MG tablet Take 1 tablet (81 mg total) by mouth daily.  Marland Kitchen losartan (COZAAR) 25 MG tablet Take 1 tablet (25 mg total) by mouth daily.  . meloxicam (MOBIC) 15 MG tablet Take 15 mg by mouth daily as needed for  pain.   . Multiple Vitamin (MULTIVITAMIN) tablet Take 1 tablet by mouth daily.  . nitroGLYCERIN (NITROSTAT) 0.4 MG SL tablet Place 1 tablet (0.4 mg total) under the tongue every 5 (five) minutes as needed for chest pain.  . [DISCONTINUED] rosuvastatin (CRESTOR) 10 MG tablet Take 1 tablet (10 mg total) by mouth daily.     Allergies:   Nitrofurantoin   Social History   Socioeconomic History  . Marital status: Married    Spouse name: Not on file  . Number of children: 5  . Years of education: BS  . Highest education level: Not on file  Occupational History  . Occupation: Retired  Tobacco Use  . Smoking status: Never  Smoker  . Smokeless tobacco: Never Used  Substance and Sexual Activity  . Alcohol use: Yes    Comment: occ  . Drug use: No  . Sexual activity: Not on file  Other Topics Concern  . Not on file  Social History Narrative   Lives   Patient is right handed.   Patient drinks 3 cups caffeine daily.   Social Determinants of Health   Financial Resource Strain:   . Difficulty of Paying Living Expenses: Not on file  Food Insecurity:   . Worried About Charity fundraiser in the Last Year: Not on file  . Ran Out of Food in the Last Year: Not on file  Transportation Needs:   . Lack of Transportation (Medical): Not on file  . Lack of Transportation (Non-Medical): Not on file  Physical Activity:   . Days of Exercise per Week: Not on file  . Minutes of Exercise per Session: Not on file  Stress:   . Feeling of Stress : Not on file  Social Connections:   . Frequency of Communication with Friends and Family: Not on file  . Frequency of Social Gatherings with Friends and Family: Not on file  . Attends Religious Services: Not on file  . Active Member of Clubs or Organizations: Not on file  . Attends Archivist Meetings: Not on file  . Marital Status: Not on file     Family History: The patient's family history includes Emphysema in his father; Heart Problems in his brother and another family member; Heart failure in his mother; Leukemia in his brother; Multiple sclerosis in his brother and another family member; Prostate cancer in his brother and another family member; Stroke in his mother.  ROS:   Please see the history of present illness.    Increased forgetfulness.  All other systems reviewed and are negative.  EKGs/Labs/Other Studies Reviewed:    The following studies were reviewed today:  Echo demonstrated diastolic dysfunction with normal systolic function and mild mitral valve prolapse without regurgitation.  Cath demonstrated widely patent coronary arteries.  EKG:   EKG a new tracing was not performed  Recent Labs: 09/08/2018: B Natriuretic Peptide 72.2 09/28/2018: ALT 16 01/10/2019: BUN 14; Creatinine, Ser 0.99; Hemoglobin 15.8; Platelets 235; Potassium 4.7; Sodium 133  Recent Lipid Panel    Component Value Date/Time   CHOL 118 09/28/2018 0901   TRIG 76 09/28/2018 0901   HDL 33 (L) 09/28/2018 0901   CHOLHDL 3.6 09/28/2018 0901   LDLCALC 70 09/28/2018 0901    Physical Exam:    VS:  BP 108/60   Pulse (!) 58   Ht 5\' 9"  (1.753 m)   Wt 157 lb (71.2 kg)   SpO2 100%   BMI 23.18 kg/m     Wt  Readings from Last 3 Encounters:  02/28/19 157 lb (71.2 kg)  02/11/19 160 lb 3.2 oz (72.7 kg)  01/14/19 150 lb (68 kg)     GEN: Appears younger than stated age.. No acute distress  Exam is unchanged. HEENT: Normal NECK: No JVD. LYMPHATICS: No lymphadenopathy CARDIAC:  RRR without murmur, gallop, or edema. VASCULAR:  Normal Pulses. No bruits. RESPIRATORY:  Clear to auscultation without rales, wheezing or rhonchi  ABDOMEN: Soft, non-tender, non-distended, No pulsatile mass, MUSCULOSKELETAL: No deformity  SKIN: Warm and dry NEUROLOGIC:  Alert and oriented x 3 PSYCHIATRIC:  Normal affect   ASSESSMENT:    1. Chest pain, unspecified type   2. Dyspnea on exertion   3. Mitral valve prolapse   4. Memory disturbance   5. Hyperlipidemia LDL goal <70   6. Educated about COVID-19 virus infection   7. Palpitations    PLAN:    In order of problems listed above:  1. Atypical chest discomfort, possibly inflammatory. 2. Possibly related to diastolic dysfunction.  Some improvement on low-dose losartan.  Limited by blood pressure. 3. Mitral valve prolapse is noted and perhaps palpitations are related.  7-day monitor to identify rhythm disturbance and also to track chronotropic response to physical activity. 4. Did not discuss 5. We have decided to discontinue Crestor.  This may be debatable but I am concerned that we have piling on more medications in a  setting where her risk seems to be very low. 6. He has received the COVID-19 vaccine. 7. 7-day monitor to rule out atrial fibrillation or other significant arrhythmias.  As needed cardiology follow-up. Return to primary care to consider other potential explanations for his multitude of complaints including inflammatory disease, and endocrine dysfunction.   Medication Adjustments/Labs and Tests Ordered: Current medicines are reviewed at length with the patient today.  Concerns regarding medicines are outlined above.  Orders Placed This Encounter  Procedures  . LONG TERM MONITOR (3-14 DAYS)   No orders of the defined types were placed in this encounter.   Patient Instructions  Medication Instructions:  1) DISCONTINUE Rosuvastatin  *If you need a refill on your cardiac medications before your next appointment, please call your pharmacy*  Lab Work: None If you have labs (blood work) drawn today and your tests are completely normal, you will receive your results only by: Marland Kitchen MyChart Message (if you have MyChart) OR . A paper copy in the mail If you have any lab test that is abnormal or we need to change your treatment, we will call you to review the results.  Testing/Procedures: Your physician recommends that you wear a monitor for 7 days.  Follow-Up: At [facility], you and your health needs are our priority.  As part of our continuing mission to provide you with exceptional heart care, we have created designated Provider Care Teams.  These Care Teams include your primary Cardiologist (physician) and Advanced Practice Providers (APPs -  Physician Assistants and Nurse Practitioners) who all work together to provide you with the care you need, when you need it.  Your next appointment:   As needed   The format for your next appointment:   In Person  Provider:   You may see Sinclair Grooms, MD or one of the following Advanced Practice Providers on your designated Care Team:     Truitt Merle, NP  Cecilie Kicks, NP  Kathyrn Drown, NP   Other Instructions      Signed, Sinclair Grooms, MD  02/28/2019  9:13 AM    [facility]

## 2019-02-28 ENCOUNTER — Encounter: Payer: Self-pay | Admitting: Interventional Cardiology

## 2019-02-28 ENCOUNTER — Other Ambulatory Visit: Payer: Self-pay

## 2019-02-28 ENCOUNTER — Encounter: Payer: Self-pay | Admitting: *Deleted

## 2019-02-28 ENCOUNTER — Ambulatory Visit: Payer: PPO | Admitting: Interventional Cardiology

## 2019-02-28 VITALS — BP 108/60 | HR 58 | Ht 69.0 in | Wt 157.0 lb

## 2019-02-28 DIAGNOSIS — R0600 Dyspnea, unspecified: Secondary | ICD-10-CM | POA: Diagnosis not present

## 2019-02-28 DIAGNOSIS — R413 Other amnesia: Secondary | ICD-10-CM

## 2019-02-28 DIAGNOSIS — R0609 Other forms of dyspnea: Secondary | ICD-10-CM

## 2019-02-28 DIAGNOSIS — I341 Nonrheumatic mitral (valve) prolapse: Secondary | ICD-10-CM

## 2019-02-28 DIAGNOSIS — R002 Palpitations: Secondary | ICD-10-CM | POA: Diagnosis not present

## 2019-02-28 DIAGNOSIS — E785 Hyperlipidemia, unspecified: Secondary | ICD-10-CM | POA: Diagnosis not present

## 2019-02-28 DIAGNOSIS — R079 Chest pain, unspecified: Secondary | ICD-10-CM | POA: Diagnosis not present

## 2019-02-28 DIAGNOSIS — Z7189 Other specified counseling: Secondary | ICD-10-CM

## 2019-02-28 NOTE — Patient Instructions (Signed)
Medication Instructions:  1) DISCONTINUE Rosuvastatin  *If you need a refill on your cardiac medications before your next appointment, please call your pharmacy*  Lab Work: None If you have labs (blood work) drawn today and your tests are completely normal, you will receive your results only by: Marland Kitchen MyChart Message (if you have MyChart) OR . A paper copy in the mail If you have any lab test that is abnormal or we need to change your treatment, we will call you to review the results.  Testing/Procedures: Your physician recommends that you wear a monitor for 7 days.  Follow-Up: At [facility], you and your health needs are our priority.  As part of our continuing mission to provide you with exceptional heart care, we have created designated Provider Care Teams.  These Care Teams include your primary Cardiologist (physician) and Advanced Practice Providers (APPs -  Physician Assistants and Nurse Practitioners) who all work together to provide you with the care you need, when you need it.  Your next appointment:   As needed   The format for your next appointment:   In Person  Provider:   You may see Sinclair Grooms, MD or one of the following Advanced Practice Providers on your designated Care Team:    Truitt Merle, NP  Cecilie Kicks, NP  Kathyrn Drown, NP   Other Instructions

## 2019-02-28 NOTE — Progress Notes (Signed)
Patient ID: Jay Ayers, male   DOB: 1945/04/23, 74 y.o.   MRN: [id number] Patient enrolled for [employer] to mail a 7 day ZIO XT Long term holter monitor to his home.  Notification and instructions sent to patient via My Chart message.

## 2019-03-04 ENCOUNTER — Ambulatory Visit (INDEPENDENT_AMBULATORY_CARE_PROVIDER_SITE_OTHER): Payer: PPO

## 2019-03-04 DIAGNOSIS — R002 Palpitations: Secondary | ICD-10-CM

## 2019-03-14 DIAGNOSIS — Z79899 Other long term (current) drug therapy: Secondary | ICD-10-CM | POA: Diagnosis not present

## 2019-03-14 DIAGNOSIS — Z125 Encounter for screening for malignant neoplasm of prostate: Secondary | ICD-10-CM | POA: Diagnosis not present

## 2019-03-14 DIAGNOSIS — R82998 Other abnormal findings in urine: Secondary | ICD-10-CM | POA: Diagnosis not present

## 2019-03-14 DIAGNOSIS — R7889 Finding of other specified substances, not normally found in blood: Secondary | ICD-10-CM | POA: Diagnosis not present

## 2019-03-18 DIAGNOSIS — Z1212 Encounter for screening for malignant neoplasm of rectum: Secondary | ICD-10-CM | POA: Diagnosis not present

## 2019-03-21 DIAGNOSIS — R002 Palpitations: Secondary | ICD-10-CM | POA: Diagnosis not present

## 2019-03-21 DIAGNOSIS — Z1331 Encounter for screening for depression: Secondary | ICD-10-CM | POA: Diagnosis not present

## 2019-03-21 DIAGNOSIS — D692 Other nonthrombocytopenic purpura: Secondary | ICD-10-CM | POA: Diagnosis not present

## 2019-03-21 DIAGNOSIS — Z0000 Encounter for general adult medical examination without abnormal findings: Secondary | ICD-10-CM | POA: Diagnosis not present

## 2019-03-21 DIAGNOSIS — M353 Polymyalgia rheumatica: Secondary | ICD-10-CM | POA: Diagnosis not present

## 2019-03-21 DIAGNOSIS — G3184 Mild cognitive impairment, so stated: Secondary | ICD-10-CM | POA: Diagnosis not present

## 2019-03-21 DIAGNOSIS — R0609 Other forms of dyspnea: Secondary | ICD-10-CM | POA: Diagnosis not present

## 2019-03-21 DIAGNOSIS — R209 Unspecified disturbances of skin sensation: Secondary | ICD-10-CM | POA: Diagnosis not present

## 2019-03-21 DIAGNOSIS — K056 Periodontal disease, unspecified: Secondary | ICD-10-CM | POA: Diagnosis not present

## 2019-03-21 DIAGNOSIS — C61 Malignant neoplasm of prostate: Secondary | ICD-10-CM | POA: Diagnosis not present

## 2019-03-21 DIAGNOSIS — R413 Other amnesia: Secondary | ICD-10-CM | POA: Diagnosis not present

## 2019-03-21 DIAGNOSIS — I5189 Other ill-defined heart diseases: Secondary | ICD-10-CM | POA: Diagnosis not present

## 2019-03-21 DIAGNOSIS — N401 Enlarged prostate with lower urinary tract symptoms: Secondary | ICD-10-CM | POA: Diagnosis not present

## 2019-03-26 ENCOUNTER — Telehealth: Payer: Self-pay | Admitting: Interventional Cardiology

## 2019-03-26 MED ORDER — METOPROLOL SUCCINATE ER 25 MG PO TB24: 25.0000 mg | ORAL_TABLET | Freq: Every day | ORAL | 3 refills | Status: DC

## 2019-03-26 NOTE — Telephone Encounter (Signed)
Patient calling for heart monitor test results.

## 2019-03-26 NOTE — Telephone Encounter (Signed)
The patient has been notified of the Monitor result and verbalized understanding.  All questions (if any) were answered. Frederik Schmidt, RN 03/26/2019 4:54 PM

## 2019-04-17 DIAGNOSIS — H43813 Vitreous degeneration, bilateral: Secondary | ICD-10-CM | POA: Diagnosis not present

## 2019-04-17 DIAGNOSIS — H43393 Other vitreous opacities, bilateral: Secondary | ICD-10-CM | POA: Diagnosis not present

## 2019-04-17 DIAGNOSIS — R519 Headache, unspecified: Secondary | ICD-10-CM | POA: Diagnosis not present

## 2019-04-17 DIAGNOSIS — H2513 Age-related nuclear cataract, bilateral: Secondary | ICD-10-CM | POA: Diagnosis not present

## 2019-04-17 DIAGNOSIS — H02834 Dermatochalasis of left upper eyelid: Secondary | ICD-10-CM | POA: Diagnosis not present

## 2019-04-17 DIAGNOSIS — H04123 Dry eye syndrome of bilateral lacrimal glands: Secondary | ICD-10-CM | POA: Diagnosis not present

## 2019-04-17 DIAGNOSIS — H02831 Dermatochalasis of right upper eyelid: Secondary | ICD-10-CM | POA: Diagnosis not present

## 2019-04-24 DIAGNOSIS — Z1212 Encounter for screening for malignant neoplasm of rectum: Secondary | ICD-10-CM | POA: Diagnosis not present

## 2019-04-29 NOTE — Progress Notes (Signed)
Cardiology Office Note:    Date:  04/30/2019   ID:  Jay Ayers, DOB 03-11-1945, MRN [id number]  PCP:  Jay Baton, MD  Cardiologist:  Jay Grooms, MD   Referring MD: Jay Baton, MD   Chief Complaint  Patient presents with  . Congestive Heart Failure  . Chest Pain    History of Present Illness:    Jay Ayers is a 74 y.o. male with a hx of vague chest discomfort, dyspnea, LVEF of 55-60% and grade 1 DD, cath 01/14/19 revealing essentially normal coronaries, LVEDP 21 mmHg. and EF 55%.   Doing well.  Palpitations have significantly decreased.  Dyspnea on exertion has improved.  The only difference that could be attributed to improvement is the addition of metoprolol succinate 25 mg/day.  He did have elevated LVEDP and possibly diastolic heart failure which could also be improved by beta-blocker therapy.  Past Medical History:  Diagnosis Date  . Arthritis    knees, back , shoulders. hx. Polycythermia Rheumatica  . Clotting disorder ([place])    "tends to bleed easily"  . GERD (gastroesophageal reflux disease)   . Memory disturbance 08/06/2013   evaluated- memory appears to be worsening- no meds helped  . Mitral valve prolapse   . Neuromuscular disorder ([place])   . PMR (polymyalgia rheumatica) (HCC)   . Prostate cancer ([place])   . Weakness of back     Past Surgical History:  Procedure Laterality Date  . APPENDECTOMY  1958  . BACK SURGERY  1995  . COLONOSCOPY WITH PROPOFOL N/A 06/02/2015   Procedure: COLONOSCOPY WITH PROPOFOL;  Surgeon: Garlan Fair, MD;  Location: [facility];  Service: Endoscopy;  Laterality: N/A;  . EYE SURGERY     "cyst on eye excised"  . LEFT HEART CATH AND CORONARY ANGIOGRAPHY N/A 01/14/2019   Procedure: LEFT HEART CATH AND CORONARY ANGIOGRAPHY;  Surgeon: Jay Crome, MD;  Location: [facility];  Service: Cardiovascular;  Laterality: N/A;  . MINOR NAILBED REPAIR Right 04/25/2013   Procedure: RIGHT THUMB NAIL BIOPSY    (MINOR  PROCEDURE);  Surgeon: Cammie Sickle., MD;  Location: [facility];  Service: Orthopedics;  Laterality: Right;  clean class  . PILONIDAL CYST EXCISION    . PROSTATE SURGERY  2005  . ROTATOR CUFF REPAIR Right 2002  . SPINE SURGERY     only epidural injection in back after lumbar surgery several yrs ago  . TONSILLECTOMY  1964  . TRANSURETHRAL RESECTION OF PROSTATE  1998  . VASECTOMY  1991    Current Medications: Current Meds  Medication Sig  . acetaminophen (TYLENOL) 500 MG tablet Take 500 mg by mouth at bedtime as needed for headache.  Marland Kitchen alum & mag hydroxide-simeth (MAALOX/MYLANTA) 200-200-20 MG/5ML suspension Take 15 mLs by mouth daily as needed for indigestion or heartburn.  . ARTIFICIAL TEAR SOLUTION OP Place 1 drop into both eyes daily as needed (dry eyes).  Marland Kitchen losartan (COZAAR) 25 MG tablet Take 1 tablet (25 mg total) by mouth daily.  . meloxicam (MOBIC) 15 MG tablet Take 15 mg by mouth daily as needed for pain.   . metoprolol succinate (TOPROL-XL) 25 MG 24 hr tablet Take 0.5 tablets (12.5 mg total) by mouth daily. Take with or immediately following a meal.  . Multiple Vitamin (MULTIVITAMIN) tablet Take 1 tablet by mouth daily.  . nitroGLYCERIN (NITROSTAT) 0.4 MG SL tablet Place 1 tablet (0.4 mg total) under the tongue every 5 (five) minutes as  needed for chest pain.  . [DISCONTINUED] aspirin EC 81 MG tablet Take 1 tablet (81 mg total) by mouth daily.  . [DISCONTINUED] metoprolol succinate (TOPROL-XL) 25 MG 24 hr tablet Take 1 tablet (25 mg total) by mouth daily. Take with or immediately following a meal.     Allergies:   Nitrofurantoin   Social History   Socioeconomic History  . Marital status: Married    Spouse name: Not on file  . Number of children: 5  . Years of education: BS  . Highest education level: Not on file  Occupational History  . Occupation: Retired  Tobacco Use  . Smoking status: Never Smoker  . Smokeless tobacco: Never Used  Substance and  Sexual Activity  . Alcohol use: Yes    Comment: occ  . Drug use: No  . Sexual activity: Not on file  Other Topics Concern  . Not on file  Social History Narrative   Lives   Patient is right handed.   Patient drinks 3 cups caffeine daily.   Social Determinants of Health   Financial Resource Strain:   . Difficulty of Paying Living Expenses:   Food Insecurity:   . Worried About Charity fundraiser in the Last Year:   . Arboriculturist in the Last Year:   Transportation Needs:   . Film/video editor (Medical):   Marland Kitchen Lack of Transportation (Non-Medical):   Physical Activity:   . Days of Exercise per Week:   . Minutes of Exercise per Session:   Stress:   . Feeling of Stress :   Social Connections:   . Frequency of Communication with Friends and Family:   . Frequency of Social Gatherings with Friends and Family:   . Attends Religious Services:   . Active Member of Clubs or Organizations:   . Attends Archivist Meetings:   Marland Kitchen Marital Status:      Family History: The patient's family history includes Emphysema in his father; Heart Problems in his brother and another family member; Heart failure in his mother; Leukemia in his brother; Multiple sclerosis in his brother and another family member; Prostate cancer in his brother and another family member; Stroke in his mother.  ROS:   Please see the history of present illness.    He notices that his exercise heart rate does not increase as much as before.  All other systems reviewed and are negative.  EKGs/Labs/Other Studies Reviewed:    The following studies were reviewed today: Coronary Angiography 2021: Diagnostic Dominance: Right   Normal left main  Widely patent LAD with 2 focal areas of mid LAD systolic compression to less than 25%.  Normal circumflex  Mild diffuse disease in RCA without obstructive disease.  LVEDP 21 mmHg.  EF 55%.  Findings consistent with chronic diastolic dysfunction/heart  failure.  RECOMMENDATIONS:   No obstructive disease to account for patient's exertional symptoms.  There is always the possibility of microvascular disease.  Diastolic dysfunction documented by the study.  Potential therapeutic considerations would be the addition of low-dose ARB therapy and or beta-blocker therapy.  EKG:  EKG new tracing is not performed.  Recent Labs: 09/08/2018: B Natriuretic Peptide 72.2 09/28/2018: ALT 16 01/10/2019: BUN 14; Creatinine, Ser 0.99; Hemoglobin 15.8; Platelets 235; Potassium 4.7; Sodium 133  Recent Lipid Panel    Component Value Date/Time   CHOL 118 09/28/2018 0901   TRIG 76 09/28/2018 0901   HDL 33 (L) 09/28/2018 0901   CHOLHDL 3.6 09/28/2018 0901  [place] 70 09/28/2018 0901    Physical Exam:    VS:  BP 106/64   Pulse (!) 46   Ht 5\' 9"  (1.753 m)   Wt 158 lb 12.8 oz (72 kg)   SpO2 100%   BMI 23.45 kg/m     Wt Readings from Last 3 Encounters:  04/30/19 158 lb 12.8 oz (72 kg)  02/28/19 157 lb (71.2 kg)  02/11/19 160 lb 3.2 oz (72.7 kg)     GEN: Appears younger than stated age.. No acute distress HEENT: Normal NECK: No JVD. LYMPHATICS: No lymphadenopathy CARDIAC:  RRR without murmur, gallop, or edema. VASCULAR:  Normal Pulses. No bruits. RESPIRATORY:  Clear to auscultation without rales, wheezing or rhonchi  ABDOMEN: Soft, non-tender, non-distended, No pulsatile mass, MUSCULOSKELETAL: No deformity  SKIN: Warm and dry NEUROLOGIC:  Alert and oriented x 3 PSYCHIATRIC:  Normal affect   ASSESSMENT:    1. Chest pain, unspecified type   2. Hyperlipidemia LDL goal <70   3. Palpitations   4. Mitral valve prolapse   5. Memory disturbance   6. Chronic diastolic heart failure ([place])   7. Educated about COVID-19 virus infection    PLAN:    In order of problems listed above:  1. Completely resolved.  Shortness of breath is also improved. 2. Did not discuss 3. Better since adding metoprolol.  Resting heart rate today measured at 46  bpm.  Decrease metoprolol succinate to 12.5 mg/day.  If palpitations recur, will go back to the 25 mg dose as he is asymptomatic despite bradycardia. 4. Not discussed 5. Not discussed 6. Shortness of breath is improved since adding losartan and low-dose beta-blocker therapy.  Continue both 7. Vaccine has been received.  Social distancing and mask wearing continues.   Medication Adjustments/Labs and Tests Ordered: Current medicines are reviewed at length with the patient today.  Concerns regarding medicines are outlined above.  No orders of the defined types were placed in this encounter.  Meds ordered this encounter  Medications  . metoprolol succinate (TOPROL-XL) 25 MG 24 hr tablet    Sig: Take 0.5 tablets (12.5 mg total) by mouth daily. Take with or immediately following a meal.    Dispense:  45 tablet    Refill:  3    Dose change    Patient Instructions  Medication Instructions:  1) DECREASE Metoprolol Succinate to 12.5mg  once daily 2) DISCONTINUE Aspirin  *If you need a refill on your cardiac medications before your next appointment, please call your pharmacy*   Lab Work: None If you have labs (blood work) drawn today and your tests are completely normal, you will receive your results only by: Marland Kitchen MyChart Message (if you have MyChart) OR . A paper copy in the mail If you have any lab test that is abnormal or we need to change your treatment, we will call you to review the results.   Testing/Procedures: None   Follow-Up: At [facility], you and your health needs are our priority.  As part of our continuing mission to provide you with exceptional heart care, we have created designated Provider Care Teams.  These Care Teams include your primary Cardiologist (physician) and Advanced Practice Providers (APPs -  Physician Assistants and Nurse Practitioners) who all work together to provide you with the care you need, when you need it.  We recommend signing up for the patient  portal called "MyChart".  Sign up information is provided on this After Visit Summary.  MyChart is used to connect  with patients for Virtual Visits (Telemedicine).  Patients are able to view lab/test results, encounter notes, upcoming appointments, etc.  Non-urgent messages can be sent to your provider as well.   To learn more about what you can do with MyChart, go to [email or web address].    Your next appointment:   As needed  The format for your next appointment:   In Person  Provider:   You may see Jay Grooms, MD or one of the following Advanced Practice Providers on your designated Care Team:    Truitt Merle, NP  Cecilie Kicks, NP  Kathyrn Drown, NP    Other Instructions      Signed, Jay Grooms, MD  04/30/2019 12:22 PM    [place]

## 2019-04-30 ENCOUNTER — Encounter: Payer: Self-pay | Admitting: Interventional Cardiology

## 2019-04-30 ENCOUNTER — Ambulatory Visit: Payer: PPO | Admitting: Interventional Cardiology

## 2019-04-30 ENCOUNTER — Other Ambulatory Visit: Payer: Self-pay

## 2019-04-30 VITALS — BP 106/64 | HR 46 | Ht 69.0 in | Wt 158.8 lb

## 2019-04-30 DIAGNOSIS — I341 Nonrheumatic mitral (valve) prolapse: Secondary | ICD-10-CM | POA: Diagnosis not present

## 2019-04-30 DIAGNOSIS — R002 Palpitations: Secondary | ICD-10-CM | POA: Diagnosis not present

## 2019-04-30 DIAGNOSIS — I5032 Chronic diastolic (congestive) heart failure: Secondary | ICD-10-CM | POA: Diagnosis not present

## 2019-04-30 DIAGNOSIS — R079 Chest pain, unspecified: Secondary | ICD-10-CM | POA: Diagnosis not present

## 2019-04-30 DIAGNOSIS — E785 Hyperlipidemia, unspecified: Secondary | ICD-10-CM

## 2019-04-30 DIAGNOSIS — Z7189 Other specified counseling: Secondary | ICD-10-CM | POA: Diagnosis not present

## 2019-04-30 DIAGNOSIS — R413 Other amnesia: Secondary | ICD-10-CM | POA: Diagnosis not present

## 2019-04-30 MED ORDER — METOPROLOL SUCCINATE ER 25 MG PO TB24: 12.5000 mg | ORAL_TABLET | Freq: Every day | ORAL | 3 refills | Status: DC

## 2019-04-30 NOTE — Patient Instructions (Addendum)
Medication Instructions:  1) DECREASE Metoprolol Succinate to 12.5mg  once daily 2) DISCONTINUE Aspirin  *If you need a refill on your cardiac medications before your next appointment, please call your pharmacy*   Lab Work: None If you have labs (blood work) drawn today and your tests are completely normal, you will receive your results only by: Marland Kitchen MyChart Message (if you have MyChart) OR . A paper copy in the mail If you have any lab test that is abnormal or we need to change your treatment, we will call you to review the results.   Testing/Procedures: None   Follow-Up: At [facility], you and your health needs are our priority.  As part of our continuing mission to provide you with exceptional heart care, we have created designated Provider Care Teams.  These Care Teams include your primary Cardiologist (physician) and Advanced Practice Providers (APPs -  Physician Assistants and Nurse Practitioners) who all work together to provide you with the care you need, when you need it.  We recommend signing up for the patient portal called "MyChart".  Sign up information is provided on this After Visit Summary.  MyChart is used to connect with patients for Virtual Visits (Telemedicine).  Patients are able to view lab/test results, encounter notes, upcoming appointments, etc.  Non-urgent messages can be sent to your provider as well.   To learn more about what you can do with MyChart, go to [email or web address].    Your next appointment:   As needed  The format for your next appointment:   In Person  Provider:   You may see Sinclair Grooms, MD or one of the following Advanced Practice Providers on your designated Care Team:    Truitt Merle, NP  Cecilie Kicks, NP  Kathyrn Drown, NP    Other Instructions

## 2019-05-21 DIAGNOSIS — D72819 Decreased white blood cell count, unspecified: Secondary | ICD-10-CM | POA: Diagnosis not present

## 2019-05-21 DIAGNOSIS — R5383 Other fatigue: Secondary | ICD-10-CM | POA: Diagnosis not present

## 2019-05-21 DIAGNOSIS — Z7689 Persons encountering health services in other specified circumstances: Secondary | ICD-10-CM | POA: Diagnosis not present

## 2019-05-21 DIAGNOSIS — E871 Hypo-osmolality and hyponatremia: Secondary | ICD-10-CM | POA: Diagnosis not present

## 2019-05-21 DIAGNOSIS — R509 Fever, unspecified: Secondary | ICD-10-CM | POA: Diagnosis not present

## 2019-05-21 DIAGNOSIS — B349 Viral infection, unspecified: Secondary | ICD-10-CM | POA: Diagnosis not present

## 2019-05-21 DIAGNOSIS — Z1152 Encounter for screening for COVID-19: Secondary | ICD-10-CM | POA: Diagnosis not present

## 2019-05-24 DIAGNOSIS — D72819 Decreased white blood cell count, unspecified: Secondary | ICD-10-CM | POA: Diagnosis not present

## 2019-05-24 DIAGNOSIS — B349 Viral infection, unspecified: Secondary | ICD-10-CM | POA: Diagnosis not present

## 2019-05-24 DIAGNOSIS — E871 Hypo-osmolality and hyponatremia: Secondary | ICD-10-CM | POA: Diagnosis not present

## 2019-05-24 DIAGNOSIS — R509 Fever, unspecified: Secondary | ICD-10-CM | POA: Diagnosis not present

## 2019-05-28 DIAGNOSIS — E871 Hypo-osmolality and hyponatremia: Secondary | ICD-10-CM | POA: Diagnosis not present

## 2019-08-13 ENCOUNTER — Telehealth: Payer: Self-pay | Admitting: Interventional Cardiology

## 2019-08-13 MED ORDER — METOPROLOL SUCCINATE ER 25 MG PO TB24: 25.0000 mg | ORAL_TABLET | Freq: Every day | ORAL | 2 refills | Status: DC

## 2019-08-13 NOTE — Telephone Encounter (Signed)
Spoke with pt and he has been taking Metoprolol Succinate 25mg  QD and is getting low on pills but was told by pharmacy it was too early to fill.  Current prescription is for 12.5mg  QD.  Per Dr. Thompson Caul last office note, if palpitations recur, we will go back to 25mg  dose.  Pt states the palps did return and that is why he has been taking the higher dose.  Has been doing fine on this dose.  Advised I will send an updated prescription to pharmacy.  Pt appreciative for call.

## 2019-08-13 NOTE — Telephone Encounter (Signed)
New Message:      Please call, he needs to discuss his Metoprolol. He was unable to get it at the pharmacy at this time. He thinks because his dose had changed, was why he was unable to get it.,

## 2019-08-30 DIAGNOSIS — I493 Ventricular premature depolarization: Secondary | ICD-10-CM | POA: Diagnosis not present

## 2019-08-30 DIAGNOSIS — R079 Chest pain, unspecified: Secondary | ICD-10-CM | POA: Diagnosis not present

## 2019-08-30 DIAGNOSIS — R0789 Other chest pain: Secondary | ICD-10-CM | POA: Diagnosis not present

## 2019-08-30 DIAGNOSIS — I5189 Other ill-defined heart diseases: Secondary | ICD-10-CM | POA: Diagnosis not present

## 2019-08-30 DIAGNOSIS — K30 Functional dyspepsia: Secondary | ICD-10-CM | POA: Diagnosis not present

## 2019-08-30 DIAGNOSIS — D692 Other nonthrombocytopenic purpura: Secondary | ICD-10-CM | POA: Diagnosis not present

## 2019-08-30 DIAGNOSIS — I341 Nonrheumatic mitral (valve) prolapse: Secondary | ICD-10-CM | POA: Diagnosis not present

## 2019-08-30 DIAGNOSIS — R0609 Other forms of dyspnea: Secondary | ICD-10-CM | POA: Diagnosis not present

## 2019-10-06 NOTE — Progress Notes (Signed)
Patient referred by Shon Baton, MD for chest pain  Subjective:   Jay Ayers, male    DOB: 1945/01/09, 74 y.o.   MRN: [id number]   Chief Complaint  Patient presents with  . Chest Pain  . New Patient (Initial Visit)     HPI  74 year old Caucasian male with chest pain.  Records reviewed and independently interpreted.   Patient is referred for second opinion foe evaluation of chest pain.   Patient is a chest pain starting fall 2020.  Chest pain episodes are not reproducible with specific activities.  For example, sometimes, he feels pain during doing weight training activities such as bench press or squats.  At other times, he has pain on laying on his left side.  In spite of that, he stays fairly active walking up to 3 miles regularly.  He underwent extensive work-up, including coronary angiogram, echocardiogram, long-term monitor.  Monitor showed occasional PACs PVCs.  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  Cardiac replacement, mild microvascular angina cannot be excluded, did not have any other evidence of cardiac etiology for his chest pain.  His LVEDP was mildly elevated.  He recommended considering either ARB or beta-blocker.  Patient has tried Pepcid without any significant improvement in his symptoms.  He has not tried a proton pump inhibitor.   Past Medical History:  Diagnosis Date  . Arthritis    knees, back , shoulders. hx. Polycythermia Rheumatica  . Clotting disorder ([place])    "tends to bleed easily"  . GERD (gastroesophageal reflux disease)   . Memory disturbance 08/06/2013   evaluated- memory appears to be worsening- no meds helped  . Mitral valve prolapse   . Neuromuscular disorder ([place])   . PMR (polymyalgia rheumatica) (HCC)   . Prostate cancer ([place])   . Weakness of back      Past Surgical History:  Procedure Laterality Date  . APPENDECTOMY  1958  . BACK SURGERY  1995    . COLONOSCOPY WITH PROPOFOL N/A 06/02/2015   Procedure: COLONOSCOPY WITH PROPOFOL;  Surgeon: Garlan Fair, MD;  Location: [facility];  Service: Endoscopy;  Laterality: N/A;  . EYE SURGERY     "cyst on eye excised"  . LEFT HEART CATH AND CORONARY ANGIOGRAPHY N/A 01/14/2019   Procedure: LEFT HEART CATH AND CORONARY ANGIOGRAPHY;  Surgeon: Belva Crome, MD;  Location: [place] [facility];  Service: Cardiovascular;  Laterality: N/A;  . MINOR NAILBED REPAIR Right 04/25/2013   Procedure: RIGHT THUMB NAIL BIOPSY    (MINOR PROCEDURE);  Surgeon: Cammie Sickle., MD;  Location: [facility];  Service: Orthopedics;  Laterality: Right;  clean class  . PILONIDAL CYST EXCISION    . PROSTATE SURGERY  2005  . ROTATOR CUFF REPAIR Right 2002  . SPINE SURGERY     only epidural injection in back after lumbar surgery several yrs ago  . TONSILLECTOMY  1964  . TRANSURETHRAL RESECTION OF PROSTATE  1998  . VASECTOMY  1991     Social History   Tobacco Use  Smoking Status Never Smoker  Smokeless Tobacco Never Used    Social History   Substance and Sexual Activity  Alcohol Use Yes   Comment: occ     Family History  Problem Relation Age of Onset  . Stroke Mother   . Heart failure Mother   . Emphysema Father   . Multiple sclerosis Brother   . Heart  Problems Brother   . Prostate cancer Brother   . Leukemia Brother   . Multiple sclerosis Other   . Prostate cancer Other   . Heart Problems Other      Current Outpatient Medications on File Prior to Visit  Medication Sig Dispense Refill  . acetaminophen (TYLENOL) 500 MG tablet Take 500 mg by mouth at bedtime as needed for headache.    Marland Kitchen alum & mag hydroxide-simeth (MAALOX/MYLANTA) 200-200-20 MG/5ML suspension Take 15 mLs by mouth daily as needed for indigestion or heartburn.    . ARTIFICIAL TEAR SOLUTION OP Place 1 drop into both eyes daily as needed (dry eyes).    Marland Kitchen losartan (COZAAR) 25 MG tablet Take 1 tablet (25 mg total)  by mouth daily. 90 tablet 3  . meloxicam (MOBIC) 15 MG tablet Take 15 mg by mouth daily as needed for pain.     . metoprolol succinate (TOPROL-XL) 25 MG 24 hr tablet Take 1 tablet (25 mg total) by mouth daily. Take with or immediately following a meal. 90 tablet 2  . Multiple Vitamin (MULTIVITAMIN) tablet Take 1 tablet by mouth daily.    . nitroGLYCERIN (NITROSTAT) 0.4 MG SL tablet Place 1 tablet (0.4 mg total) under the tongue every 5 (five) minutes as needed for chest pain. 90 tablet 3   No current facility-administered medications on file prior to visit.    Cardiovascular and other pertinent studies:  EKG 10/07/2019: Sinus rhythm 50 bpm Normal EKG  Long term monitor 3-14 days 03/2019:  NSR  NSVT with 28 beat episode averaging 161 bpm.  SVT episode lasting 29 beats at 116 bpm  PVC burden 1.3 %  PAC burden < 1%    Coronary angiography (01/14/2019 by Dr. Daneen Schick):  Normal left main  Widely patent LAD with 2 focal areas of mid LAD systolic compression to less than 25%.  Normal circumflex  Mild diffuse disease in RCA without obstructive disease.  LVEDP 21 mmHg.  EF 55%.  Findings consistent with chronic diastolic dysfunction/heart failure.  RECOMMENDATIONS:   No obstructive disease to account for patient's exertional symptoms.  There is always the possibility of microvascular disease.  Diastolic dysfunction documented by the study.  Potential therapeutic considerations would be the addition of low-dose ARB therapy and or beta-blocker therapy.  Echocardiogram 09/19/2018: 1. The left ventricle has normal systolic function, with an ejection  fraction of 55-60%. The cavity size was normal. Left ventricular diastolic  Doppler parameters are consistent with impaired relaxation. No evidence of  left ventricular regional wall  motion abnormalities.  2. The right ventricle has normal systolic function. The cavity was  normal. There is no increase in right ventricular  wall thickness.  3. Mild mitral valve prolapse.  4. The mitral valve is myxomatous. Mild thickening of the mitral valve  leaflet.  5. The aortic valve is tricuspid. Mild thickening of the aortic valve.  Aortic valve regurgitation is mild by color flow Doppler.  6. The aorta is normal unless otherwise noted.   Recent labs: 01/10/2019: Glucose 87, BUN/Cr 14/0.99. EGFR 75. Na/K 133/4.7. Rest of the CMP normal H/H 15/46. MCV 92. Platelets 235 Chol 118, TG 76, HDL 33, LDL 70     Review of Systems  Cardiovascular: Positive for chest pain and leg swelling (Occasional). Negative for dyspnea on exertion, palpitations and syncope.         Vitals:   10/07/19 1327  BP: (!) 118/59  Pulse: (!) 52  SpO2: 99%     Body  mass index is 23.04 kg/m. Filed Weights   10/07/19 1327  Weight: 156 lb (70.8 kg)     Objective:   Physical Exam Vitals and nursing note reviewed.  Constitutional:      General: He is not in acute distress. Neck:     Vascular: No JVD.  Cardiovascular:     Rate and Rhythm: Normal rate and regular rhythm.     Heart sounds: Normal heart sounds. No murmur heard.   Pulmonary:     Effort: Pulmonary effort is normal.     Breath sounds: Normal breath sounds. No wheezing or rales.           Assessment & Recommendations:   74 year old Caucasian male with chest pain.  Chest pain: Unlikely to be anginal.  Reviewed prior cardiac work-up including coronary angiogram.  Independently interpreted.  He does not have any epicardial coronary artery disease.  All microvascular disease cannot be excluded, suspicion is low given completely normal flow in his epicardial coronary arteries.  I agree with Dr. Bennetta Laos prior assessment that his chest pain is likely of noncardiac origin.  Could consider omeprazole or other proton pump inhibitor, in case GERD is contributing to his symptoms.  His occasional leg swelling is more likely to be due to varicose veins then due to heart  failure.  I will see him on as-needed basis.   Thank you for referring the patient to [place]. Please feel free to contact with any questions.   Nigel Mormon, MD Pager: [phone] Office: [phone]

## 2019-10-07 ENCOUNTER — Encounter: Payer: Self-pay | Admitting: Cardiology

## 2019-10-07 ENCOUNTER — Ambulatory Visit: Payer: PPO | Admitting: Cardiology

## 2019-10-07 ENCOUNTER — Other Ambulatory Visit: Payer: Self-pay

## 2019-10-07 VITALS — BP 118/59 | HR 52 | Ht 69.0 in | Wt 156.0 lb

## 2019-10-07 DIAGNOSIS — R079 Chest pain, unspecified: Secondary | ICD-10-CM | POA: Diagnosis not present

## 2020-02-17 ENCOUNTER — Other Ambulatory Visit: Payer: Self-pay | Admitting: Physician Assistant

## 2020-03-19 DIAGNOSIS — E786 Lipoprotein deficiency: Secondary | ICD-10-CM | POA: Diagnosis not present

## 2020-03-19 DIAGNOSIS — Z125 Encounter for screening for malignant neoplasm of prostate: Secondary | ICD-10-CM | POA: Diagnosis not present

## 2020-03-19 DIAGNOSIS — E871 Hypo-osmolality and hyponatremia: Secondary | ICD-10-CM | POA: Diagnosis not present

## 2020-03-26 DIAGNOSIS — I5189 Other ill-defined heart diseases: Secondary | ICD-10-CM | POA: Diagnosis not present

## 2020-03-26 DIAGNOSIS — Z0000 Encounter for general adult medical examination without abnormal findings: Secondary | ICD-10-CM | POA: Diagnosis not present

## 2020-03-26 DIAGNOSIS — G3184 Mild cognitive impairment, so stated: Secondary | ICD-10-CM | POA: Diagnosis not present

## 2020-03-26 DIAGNOSIS — Z1331 Encounter for screening for depression: Secondary | ICD-10-CM | POA: Diagnosis not present

## 2020-03-26 DIAGNOSIS — R0609 Other forms of dyspnea: Secondary | ICD-10-CM | POA: Diagnosis not present

## 2020-03-26 DIAGNOSIS — M25569 Pain in unspecified knee: Secondary | ICD-10-CM | POA: Diagnosis not present

## 2020-03-26 DIAGNOSIS — N401 Enlarged prostate with lower urinary tract symptoms: Secondary | ICD-10-CM | POA: Diagnosis not present

## 2020-03-26 DIAGNOSIS — I493 Ventricular premature depolarization: Secondary | ICD-10-CM | POA: Diagnosis not present

## 2020-03-26 DIAGNOSIS — Z1212 Encounter for screening for malignant neoplasm of rectum: Secondary | ICD-10-CM | POA: Diagnosis not present

## 2020-03-26 DIAGNOSIS — D692 Other nonthrombocytopenic purpura: Secondary | ICD-10-CM | POA: Diagnosis not present

## 2020-03-26 DIAGNOSIS — I341 Nonrheumatic mitral (valve) prolapse: Secondary | ICD-10-CM | POA: Diagnosis not present

## 2020-03-26 DIAGNOSIS — Z1389 Encounter for screening for other disorder: Secondary | ICD-10-CM | POA: Diagnosis not present

## 2020-03-26 DIAGNOSIS — R0789 Other chest pain: Secondary | ICD-10-CM | POA: Diagnosis not present

## 2020-03-26 DIAGNOSIS — R82998 Other abnormal findings in urine: Secondary | ICD-10-CM | POA: Diagnosis not present

## 2020-04-16 IMAGING — CT CT CERVICAL SPINE W/O CM
4 of 7 series · 12 of 33 positions shown, 13 images · non-contrast
Comparison: 08/15/2013 brain MRI. 07/28/2009 cervical spine
radiographs.

CLINICAL DATA: Fall from ladder with head injury. Occipital
laceration.

EXAM:
CT HEAD WITHOUT CONTRAST
CT CERVICAL SPINE WITHOUT CONTRAST
TECHNIQUE: Multidetector CT imaging of the head and cervical spine was
performed following the standard protocol without intravenous
contrast. Multiplanar CT image reconstructions of the cervical spine
were also generated.

[Series 7: c_spine 2.0 i30s 3 · axial · 0.39mm/px · z∈[-295,-167]mm · 4 of 108 slices shown, 5 images]
[im 22/108  soft-tissue]
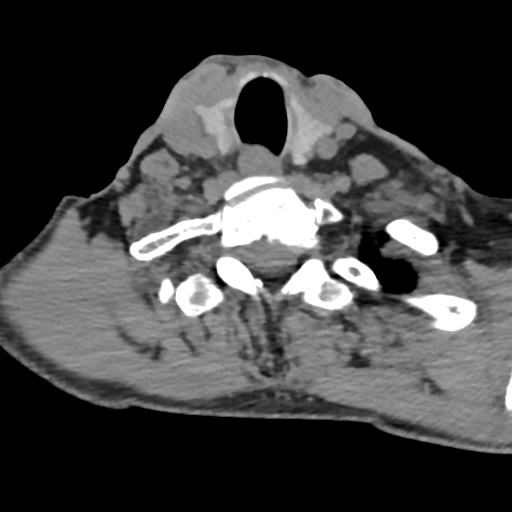
[im 22/108  bone]
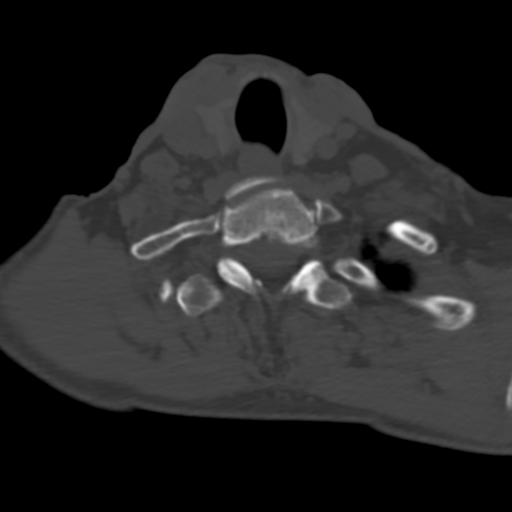
[im 43/108  bone]
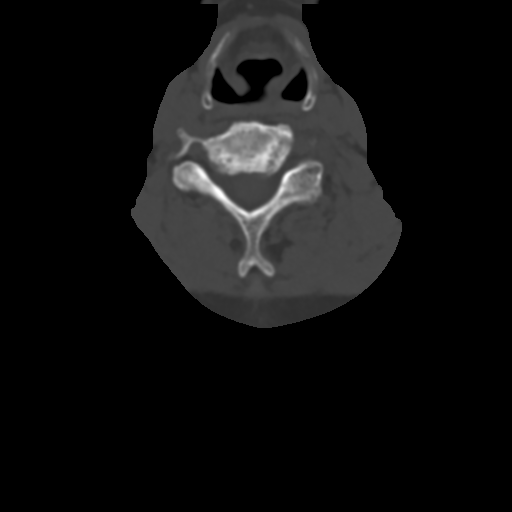
[im 65/108  bone]
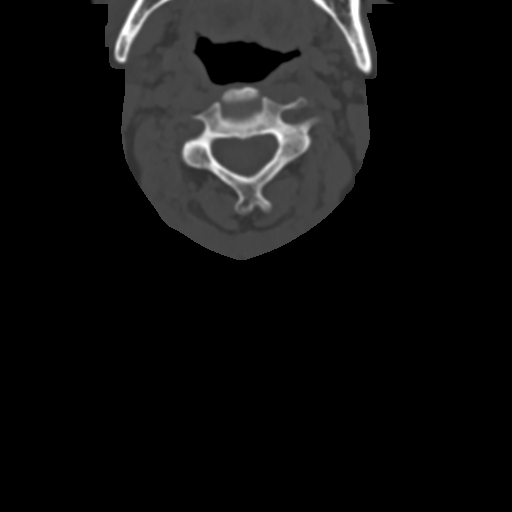
[im 86/108  bone]
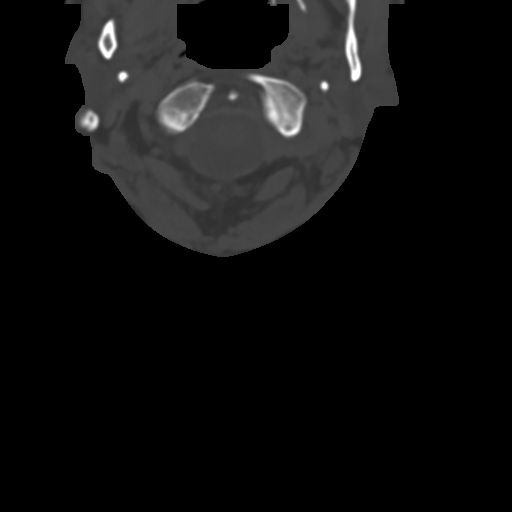

[Series 9: coronals · coronal · 0.25mm/px · 1 of 83 slices shown]
[im 42/83  bone]
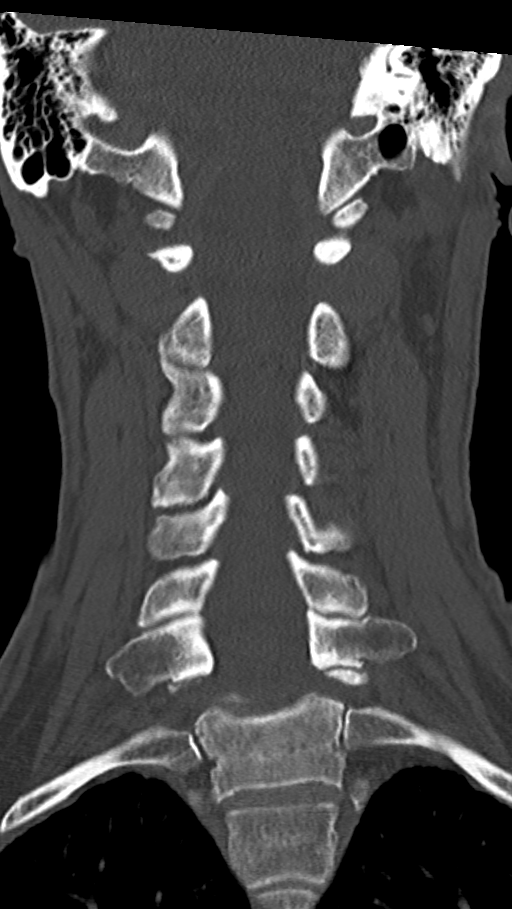

[Series 10: sagittals · sagittal · 0.23mm/px · 5 of 58 slices shown]
[im 10/58  bone]
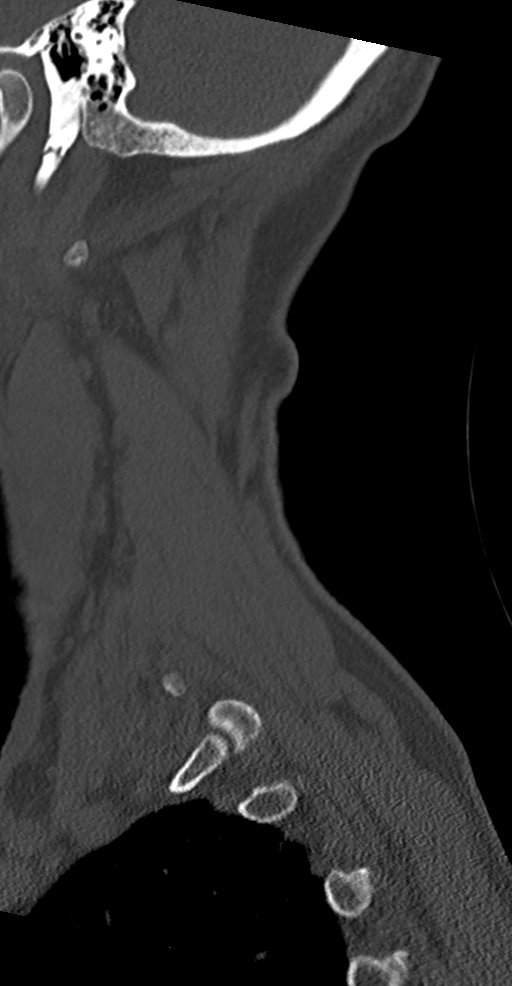
[im 20/58  bone]
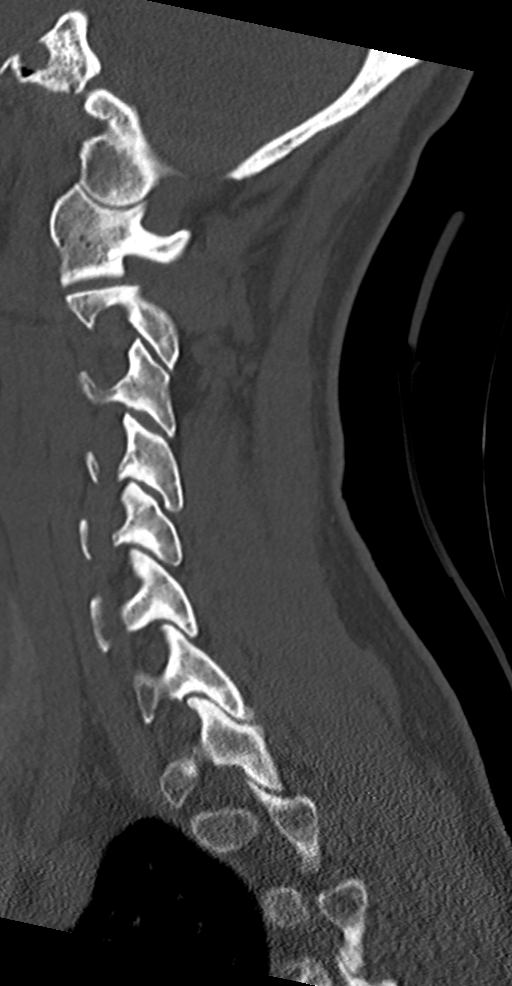
[im 29/58  bone]
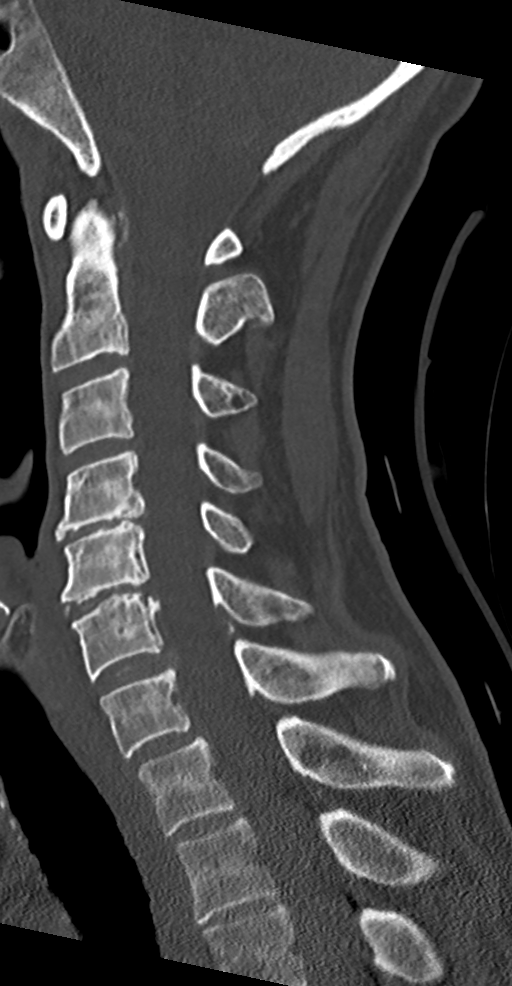
[im 39/58  bone]
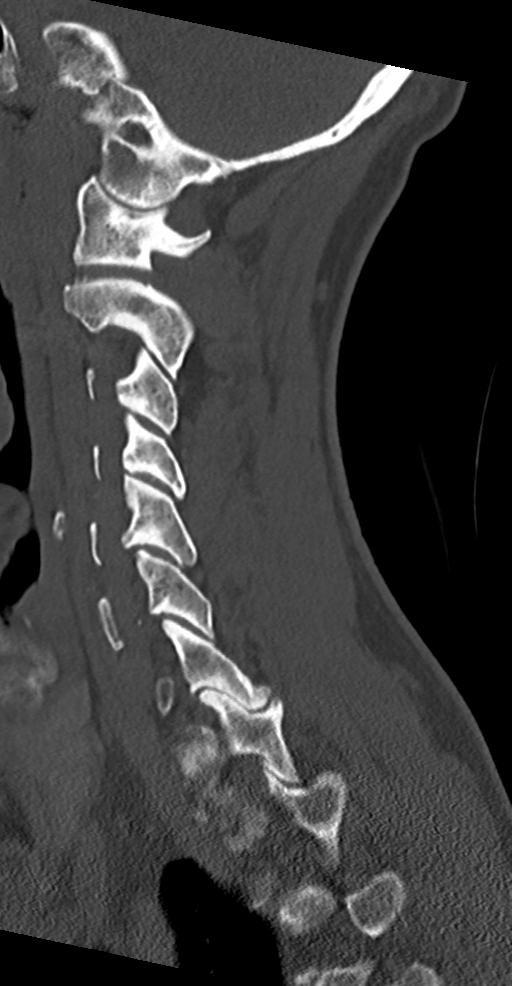
[im 48/58  bone]
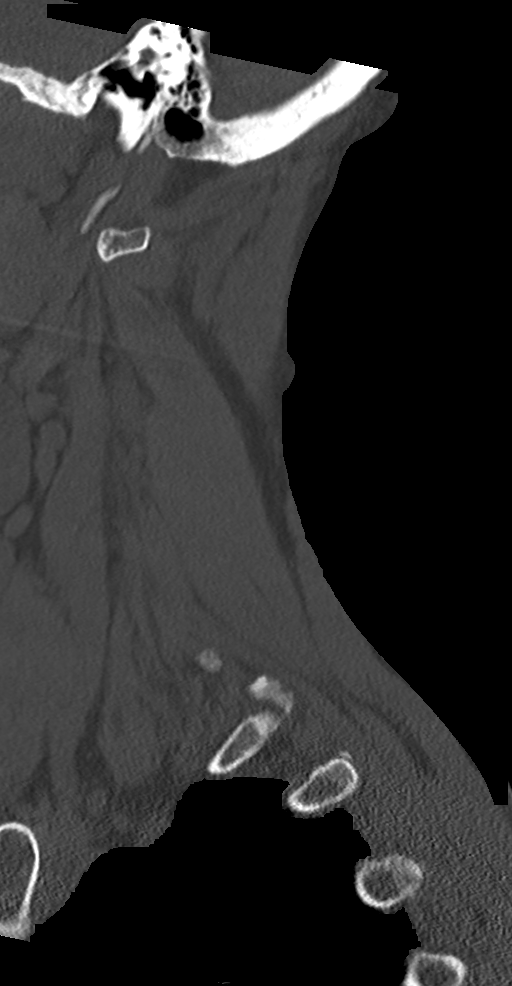

[Series 11: orthogonals · axial · 0.23mm/px · z∈[-318,-275]mm · 2 of 109 slices shown]
[im 22/109  bone]
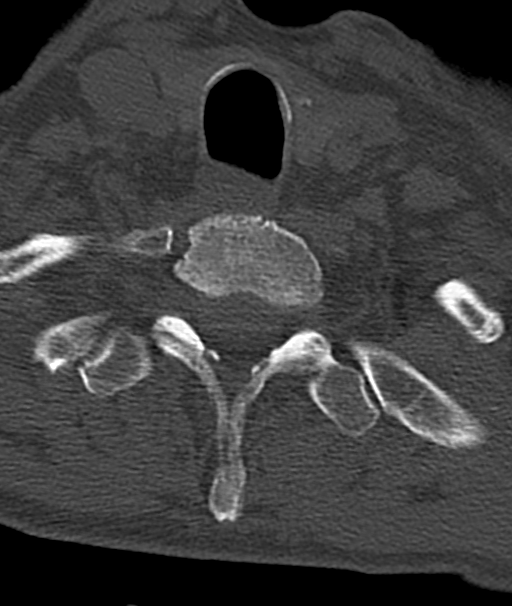
[im 44/109  bone]
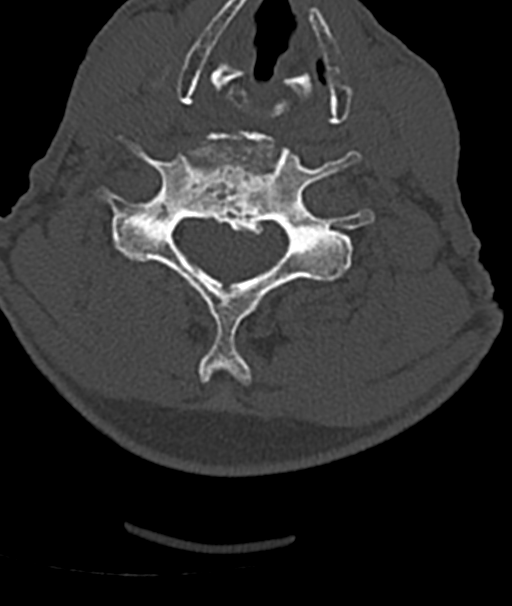

[12 of 33 positions shown; findings below may reference images not displayed]

FINDINGS: CT HEAD FINDINGS

Brain: No evidence of parenchymal hemorrhage or extra-axial fluid
collection. No mass lesion, mass effect, or midline shift. No CT
evidence of acute infarction. Cerebral volume is age appropriate. No
ventriculomegaly.

Vascular: No acute abnormality.

Skull: No evidence of calvarial fracture.

Sinuses/Orbits: The visualized paranasal sinuses are essentially
clear.

Other: Scalp emphysema in the left parieto-occipital region. The
mastoid air cells are unopacified.

CT CERVICAL SPINE FINDINGS

Alignment: Straightening of the cervical spine. No facet
subluxation. Dens is well positioned between the lateral masses of
C1. Minimal 2 mm anterolisthesis at C7-T1, unchanged since 0244
radiographs.

Skull base and vertebrae: No acute fracture. No primary bone lesion
or focal pathologic process.

Soft tissues and spinal canal: No prevertebral edema. No visible
canal hematoma.

Disc levels: Marked multilevel cervical degenerative disc disease,
most prominent at C4-5 and C5-6. Mild bilateral facet arthropathy.
Mild degenerative foraminal stenosis on the left at C3-4. Moderate
degenerative foraminal stenosis bilaterally at C4-5. Mild degenerate
foraminal stenosis on the left at C5-6.

Upper chest: No acute abnormality.

Other: Visualized mastoid air cells appear clear. No discrete
thyroid nodules. No pathologically enlarged cervical nodes.
IMPRESSION: CT HEAD:

1. Left parieto-occipital scalp emphysema.
2. No evidence of acute intracranial abnormality. No evidence of
calvarial fracture.

CT CERVICAL SPINE:

1. No cervical spine fracture or acute subluxation.
2. Moderate to marked multilevel cervical degenerative changes as
detailed.

## 2020-05-18 ENCOUNTER — Other Ambulatory Visit: Payer: Self-pay | Admitting: Interventional Cardiology

## 2020-05-22 DIAGNOSIS — Z1152 Encounter for screening for COVID-19: Secondary | ICD-10-CM | POA: Diagnosis not present

## 2020-05-22 DIAGNOSIS — J069 Acute upper respiratory infection, unspecified: Secondary | ICD-10-CM | POA: Diagnosis not present

## 2020-05-22 DIAGNOSIS — R059 Cough, unspecified: Secondary | ICD-10-CM | POA: Diagnosis not present

## 2020-05-22 DIAGNOSIS — I341 Nonrheumatic mitral (valve) prolapse: Secondary | ICD-10-CM | POA: Diagnosis not present

## 2020-06-09 ENCOUNTER — Telehealth: Payer: Self-pay | Admitting: Interventional Cardiology

## 2020-06-09 NOTE — Telephone Encounter (Signed)
RN call to patient who would like Dr. Tamala Julian to advise if he needs a lower dose of his metoprolol or Losartan. Patient states he remembers they had to decrease it in the past. Patient reports his blood pressure has been averaging around116/52 and HR-50 on a daily basis. Patient reports dizziness on occasion with exertion if he is bending over and standing up. He would like to know what Dr.Smith would like him to do. RN advised I would send a message to [place]. Patient verbalized understanding.

## 2020-06-09 NOTE — Telephone Encounter (Signed)
Patient was calling in to get clarification on the dosage for these to medication metoprolol succinate (TOPROL-XL) 25 MG 24 hr tablet and losartan (COZAAR) 25 MG tablet. Please advise

## 2020-06-11 MED ORDER — METOPROLOL SUCCINATE ER 25 MG PO TB24: 1.0000 | ORAL_TABLET | Freq: Every day | ORAL | 1 refills | Status: DC

## 2020-06-11 NOTE — Telephone Encounter (Signed)
Patient is following up. He requested a call back on his cell phone at [phone].

## 2020-06-11 NOTE — Telephone Encounter (Signed)
Reviewed with Dr. Tamala Julian and he said to have pt stop Losartan.  Monitor BP and if consistently 140/80 or higher, let us know.  Pt agreeable to plan.

## 2020-06-11 NOTE — Telephone Encounter (Signed)
Spoke with pt and advised I am still waiting to hear from Dr. Tamala Julian.  Advised I will be in touch as soon as he responds.  Pt appreciative for update.

## 2020-09-23 DIAGNOSIS — Z23 Encounter for immunization: Secondary | ICD-10-CM | POA: Diagnosis not present

## 2020-10-13 DIAGNOSIS — H02831 Dermatochalasis of right upper eyelid: Secondary | ICD-10-CM | POA: Diagnosis not present

## 2020-10-13 DIAGNOSIS — H43393 Other vitreous opacities, bilateral: Secondary | ICD-10-CM | POA: Diagnosis not present

## 2020-10-13 DIAGNOSIS — H02834 Dermatochalasis of left upper eyelid: Secondary | ICD-10-CM | POA: Diagnosis not present

## 2020-10-13 DIAGNOSIS — R519 Headache, unspecified: Secondary | ICD-10-CM | POA: Diagnosis not present

## 2020-10-13 DIAGNOSIS — H04123 Dry eye syndrome of bilateral lacrimal glands: Secondary | ICD-10-CM | POA: Diagnosis not present

## 2020-10-13 DIAGNOSIS — H2513 Age-related nuclear cataract, bilateral: Secondary | ICD-10-CM | POA: Diagnosis not present

## 2020-10-13 DIAGNOSIS — H43813 Vitreous degeneration, bilateral: Secondary | ICD-10-CM | POA: Diagnosis not present

## 2020-11-06 NOTE — Progress Notes (Signed)
Cardiology Office Note:    Date:  11/09/2020   ID:  Jay Ayers, DOB 09-12-1945, MRN [id number]  PCP:  Shon Baton, MD  Cardiologist:  Sinclair Grooms, MD   Referring MD: Shon Baton, MD   Chief Complaint  Patient presents with   Chest Pain    History of Present Illness:    Jay Ayers is a 75 y.o. male with a hx of vague chest discomfort, dyspnea, LVEF of 55-60% and grade 1 DD, cath 01/14/19 revealing essentially normal coronaries, LVEDP 21 mmHg.  and EF 55%.   In interval since our last visit, had second opinion consult with Dr. Vernell Leep who had similar clinical diagnosis as my prior thoughts.   He feels well.  He still has intermittent left axillary, left subclavicular, left lower cervical, and left mid clavicular line discomfort.  Episodes are fleeting.  There is some positional component.  The discomfort is not exertional related.  He has some orthostatic dizzy episodes.  Past Medical History:  Diagnosis Date   Arthritis    knees, back , shoulders. hx. Polycythermia Rheumatica   Clotting disorder ([place])    "tends to bleed easily"   GERD (gastroesophageal reflux disease)    Memory disturbance 08/06/2013   evaluated- memory appears to be worsening- no meds helped   Mitral valve prolapse    Neuromuscular disorder (HCC)    PMR (polymyalgia rheumatica) (HCC)    Prostate cancer (HCC)    Weakness of back     Past Surgical History:  Procedure Laterality Date   [place]   COLONOSCOPY WITH PROPOFOL N/A 06/02/2015   Procedure: COLONOSCOPY WITH PROPOFOL;  Surgeon: Garlan Fair, MD;  Location: [facility];  Service: Endoscopy;  Laterality: N/A;   EYE SURGERY     "cyst on eye excised"   LEFT HEART CATH AND CORONARY ANGIOGRAPHY N/A 01/14/2019   Procedure: LEFT HEART CATH AND CORONARY ANGIOGRAPHY;  Surgeon: Belva Crome, MD;  Location: [facility] [facility];  Service: Cardiovascular;  Laterality: N/A;   MINOR NAILBED REPAIR  Right 04/25/2013   Procedure: RIGHT THUMB NAIL BIOPSY    (MINOR PROCEDURE);  Surgeon: Cammie Sickle., MD;  Location: [facility];  Service: Orthopedics;  Laterality: Right;  clean class   PILONIDAL CYST EXCISION     PROSTATE SURGERY  2005   ROTATOR CUFF REPAIR Right 2002   SPINE SURGERY     only epidural injection in back after lumbar surgery several yrs ago   [place]    Current Medications: Current Meds  Medication Sig   acetaminophen (TYLENOL) 500 MG tablet Take 500 mg by mouth at bedtime as needed for headache.   meloxicam (MOBIC) 15 MG tablet Take 15 mg by mouth daily as needed for pain.    Multiple Vitamin (MULTIVITAMIN) tablet Take 1 tablet by mouth daily.   omeprazole (PRILOSEC) 20 MG capsule Take 20 mg by mouth every other day.   [DISCONTINUED] metoprolol succinate (TOPROL-XL) 25 MG 24 hr tablet Take 1 tablet (25 mg total) by mouth daily. TAKE WITH OR IMMEDIATELY FOLLOWING A MEAL.-Keep appointment in November for further refills     Allergies:   Nitrofurantoin   Social History   Socioeconomic History   Marital status: Married    Spouse name: Not on file   Number of children: 5   Years of  education: BS   Highest education level: Not on file  Occupational History   Occupation: Retired  Tobacco Use   Smoking status: Never   Smokeless tobacco: Never  Substance and Sexual Activity   Alcohol use: Yes    Comment: occ   Drug use: No   Sexual activity: Not on file  Other Topics Concern   Not on file  Social History Narrative   Lives   Patient is right handed.   Patient drinks 3 cups caffeine daily.   Social Determinants of Health   Financial Resource Strain: Not on file  Food Insecurity: Not on file  Transportation Needs: Not on file  Physical Activity: Not on file  Stress: Not on file  Social Connections: Not on file     Family History: The patient's family history  includes Emphysema in his father; Heart Problems in his brother and another family member; Heart failure in his mother; Leukemia in his brother; Multiple sclerosis in his brother and another family member; Prostate cancer in his brother and another family member; Stroke in his mother.  ROS:   Please see the history of present illness.    Still exercises quite heavily.  Has some lower back stiffness.  All other systems reviewed and are negative.  EKGs/Labs/Other Studies Reviewed:    The following studies were reviewed today: Recent second opinion from Dr. Virgina Jock  EKG:  EKG sinus bradycardia, PR 162 ms, her heart rate 48 bpm, poor R wave progression V1 through V4.  Left axis deviation.  When compared to the prior tracing and October 2021, the heart rate is slightly slower.  Recent Labs: No results found for requested labs within last 8760 hours.  Recent Lipid Panel    Component Value Date/Time   CHOL 118 09/28/2018 0901   TRIG 76 09/28/2018 0901   HDL 33 (L) 09/28/2018 0901   CHOLHDL 3.6 09/28/2018 0901   LDLCALC 70 09/28/2018 0901    Physical Exam:    VS:  BP 132/88   Pulse (!) 48   Ht 5\' 9"  (1.753 m)   Wt 152 lb 6.4 oz (69.1 kg)   SpO2 99%   BMI 22.51 kg/m     Wt Readings from Last 3 Encounters:  11/09/20 152 lb 6.4 oz (69.1 kg)  10/07/19 156 lb (70.8 kg)  04/30/19 158 lb 12.8 oz (72 kg)     GEN: Healthy appearing. No acute distress HEENT: Normal NECK: No JVD. LYMPHATICS: No lymphadenopathy CARDIAC: No murmur. RRR no gallop, or edema. VASCULAR:  Normal Pulses. No bruits. RESPIRATORY:  Clear to auscultation without rales, wheezing or rhonchi  ABDOMEN: Soft, non-tender, non-distended, No pulsatile mass, MUSCULOSKELETAL: No deformity  SKIN: Warm and dry NEUROLOGIC:  Alert and oriented x 3 PSYCHIATRIC:  Normal affect   ASSESSMENT:    1. Chest pain, unspecified type   2. Hyperlipidemia LDL goal <70   3. Palpitations   4. Mitral valve prolapse   5. Chronic  diastolic heart failure (HCC)    PLAN:    In order of problems listed above:  Continues and is noncardiac in nature..  I believe this is likely musculoskeletal or cervical spine/thoracic spine related. Continue primary prevention Resolved on low-dose beta-blocker therapy. No clinical evidence of mitral valve prolapse. No clinical evidence of heart failure. With heart rate less than 50, orthostatic dizziness, and when I recommended he decrease metoprolol succinate to 12.5 mg/day for 7 to 10 days then discontinue.  If palpitations recur, resume at 12.5 mg daily.  As needed follow-up.  Continue active lifestyle.   Medication Adjustments/Labs and Tests Ordered: Current medicines are reviewed at length with the patient today.  Concerns regarding medicines are outlined above.  Orders Placed This Encounter  Procedures   EKG 12-Lead   No orders of the defined types were placed in this encounter.   Patient Instructions  Medication Instructions:  1) DECREASE Metoprolol to 12.5mg  once daily for 2 weeks and then discontinue.  *If you need a refill on your cardiac medications before your next appointment, please call your pharmacy*   Lab Work: None If you have labs (blood work) drawn today and your tests are completely normal, you will receive your results only by: [place] (if you have MyChart) OR A paper copy in the mail If you have any lab test that is abnormal or we need to change your treatment, we will call you to review the results.   Testing/Procedures: None   Follow-Up: At [facility], you and your health needs are our priority.  As part of our continuing mission to provide you with exceptional heart care, we have created designated Provider Care Teams.  These Care Teams include your primary Cardiologist (physician) and Advanced Practice Providers (APPs -  Physician Assistants and Nurse Practitioners) who all work together to provide you with the care you need,  when you need it.  We recommend signing up for the patient portal called "MyChart".  Sign up information is provided on this After Visit Summary.  MyChart is used to connect with patients for Virtual Visits (Telemedicine).  Patients are able to view lab/test results, encounter notes, upcoming appointments, etc.  Non-urgent messages can be sent to your provider as well.   To learn more about what you can do with MyChart, go to [email or web address].    Your next appointment:   As needed  The format for your next appointment:   In Person  Provider:   Sinclair Grooms, MD     Other Instructions     Signed, Sinclair Grooms, MD  11/09/2020 12:38 PM    [place]

## 2020-11-09 ENCOUNTER — Other Ambulatory Visit: Payer: Self-pay

## 2020-11-09 ENCOUNTER — Ambulatory Visit: Payer: PPO | Admitting: Interventional Cardiology

## 2020-11-09 ENCOUNTER — Encounter: Payer: Self-pay | Admitting: Interventional Cardiology

## 2020-11-09 VITALS — BP 132/88 | HR 48 | Ht 69.0 in | Wt 152.4 lb

## 2020-11-09 DIAGNOSIS — E785 Hyperlipidemia, unspecified: Secondary | ICD-10-CM | POA: Diagnosis not present

## 2020-11-09 DIAGNOSIS — R002 Palpitations: Secondary | ICD-10-CM | POA: Diagnosis not present

## 2020-11-09 DIAGNOSIS — R001 Bradycardia, unspecified: Secondary | ICD-10-CM | POA: Diagnosis not present

## 2020-11-09 DIAGNOSIS — I341 Nonrheumatic mitral (valve) prolapse: Secondary | ICD-10-CM | POA: Diagnosis not present

## 2020-11-09 DIAGNOSIS — R079 Chest pain, unspecified: Secondary | ICD-10-CM | POA: Diagnosis not present

## 2020-11-09 DIAGNOSIS — I5032 Chronic diastolic (congestive) heart failure: Secondary | ICD-10-CM

## 2020-11-09 NOTE — Patient Instructions (Signed)
Medication Instructions:  1) DECREASE Metoprolol to 12.5mg  once daily for 2 weeks and then discontinue.  *If you need a refill on your cardiac medications before your next appointment, please call your pharmacy*   Lab Work: None If you have labs (blood work) drawn today and your tests are completely normal, you will receive your results only by: [place] (if you have MyChart) OR A paper copy in the mail If you have any lab test that is abnormal or we need to change your treatment, we will call you to review the results.   Testing/Procedures: None   Follow-Up: At [facility], you and your health needs are our priority.  As part of our continuing mission to provide you with exceptional heart care, we have created designated Provider Care Teams.  These Care Teams include your primary Cardiologist (physician) and Advanced Practice Providers (APPs -  Physician Assistants and Nurse Practitioners) who all work together to provide you with the care you need, when you need it.  We recommend signing up for the patient portal called "MyChart".  Sign up information is provided on this After Visit Summary.  MyChart is used to connect with patients for Virtual Visits (Telemedicine).  Patients are able to view lab/test results, encounter notes, upcoming appointments, etc.  Non-urgent messages can be sent to your provider as well.   To learn more about what you can do with MyChart, go to [email or web address].    Your next appointment:   As needed  The format for your next appointment:   In Person  Provider:   Sinclair Grooms, MD     Other Instructions

## 2020-12-04 DIAGNOSIS — M25552 Pain in left hip: Secondary | ICD-10-CM | POA: Diagnosis not present

## 2020-12-04 DIAGNOSIS — M25522 Pain in left elbow: Secondary | ICD-10-CM | POA: Diagnosis not present

## 2020-12-11 DIAGNOSIS — M25522 Pain in left elbow: Secondary | ICD-10-CM | POA: Diagnosis not present

## 2020-12-11 DIAGNOSIS — M25552 Pain in left hip: Secondary | ICD-10-CM | POA: Diagnosis not present

## 2021-03-24 DIAGNOSIS — E871 Hypo-osmolality and hyponatremia: Secondary | ICD-10-CM | POA: Diagnosis not present

## 2021-03-24 DIAGNOSIS — E786 Lipoprotein deficiency: Secondary | ICD-10-CM | POA: Diagnosis not present

## 2021-03-24 DIAGNOSIS — Z125 Encounter for screening for malignant neoplasm of prostate: Secondary | ICD-10-CM | POA: Diagnosis not present

## 2021-03-30 DIAGNOSIS — R82998 Other abnormal findings in urine: Secondary | ICD-10-CM | POA: Diagnosis not present

## 2021-03-30 DIAGNOSIS — D692 Other nonthrombocytopenic purpura: Secondary | ICD-10-CM | POA: Diagnosis not present

## 2021-03-30 DIAGNOSIS — I5189 Other ill-defined heart diseases: Secondary | ICD-10-CM | POA: Diagnosis not present

## 2021-03-30 DIAGNOSIS — Z1331 Encounter for screening for depression: Secondary | ICD-10-CM | POA: Diagnosis not present

## 2021-03-30 DIAGNOSIS — E871 Hypo-osmolality and hyponatremia: Secondary | ICD-10-CM | POA: Diagnosis not present

## 2021-03-30 DIAGNOSIS — E786 Lipoprotein deficiency: Secondary | ICD-10-CM | POA: Diagnosis not present

## 2021-03-30 DIAGNOSIS — N401 Enlarged prostate with lower urinary tract symptoms: Secondary | ICD-10-CM | POA: Diagnosis not present

## 2021-03-30 DIAGNOSIS — Z1389 Encounter for screening for other disorder: Secondary | ICD-10-CM | POA: Diagnosis not present

## 2021-03-30 DIAGNOSIS — D72819 Decreased white blood cell count, unspecified: Secondary | ICD-10-CM | POA: Diagnosis not present

## 2021-03-30 DIAGNOSIS — I493 Ventricular premature depolarization: Secondary | ICD-10-CM | POA: Diagnosis not present

## 2021-03-30 DIAGNOSIS — M25569 Pain in unspecified knee: Secondary | ICD-10-CM | POA: Diagnosis not present

## 2021-03-30 DIAGNOSIS — R0789 Other chest pain: Secondary | ICD-10-CM | POA: Diagnosis not present

## 2021-03-30 DIAGNOSIS — G3184 Mild cognitive impairment, so stated: Secondary | ICD-10-CM | POA: Diagnosis not present

## 2021-03-30 DIAGNOSIS — Z1212 Encounter for screening for malignant neoplasm of rectum: Secondary | ICD-10-CM | POA: Diagnosis not present

## 2021-03-30 DIAGNOSIS — Z0000 Encounter for general adult medical examination without abnormal findings: Secondary | ICD-10-CM | POA: Diagnosis not present

## 2021-03-30 DIAGNOSIS — K30 Functional dyspepsia: Secondary | ICD-10-CM | POA: Diagnosis not present

## 2021-05-25 DIAGNOSIS — D2262 Melanocytic nevi of left upper limb, including shoulder: Secondary | ICD-10-CM | POA: Diagnosis not present

## 2021-05-25 DIAGNOSIS — D225 Melanocytic nevi of trunk: Secondary | ICD-10-CM | POA: Diagnosis not present

## 2021-05-25 DIAGNOSIS — L821 Other seborrheic keratosis: Secondary | ICD-10-CM | POA: Diagnosis not present

## 2021-05-25 DIAGNOSIS — D692 Other nonthrombocytopenic purpura: Secondary | ICD-10-CM | POA: Diagnosis not present

## 2021-05-25 DIAGNOSIS — Z85828 Personal history of other malignant neoplasm of skin: Secondary | ICD-10-CM | POA: Diagnosis not present

## 2021-05-25 DIAGNOSIS — L820 Inflamed seborrheic keratosis: Secondary | ICD-10-CM | POA: Diagnosis not present

## 2021-05-25 DIAGNOSIS — D2272 Melanocytic nevi of left lower limb, including hip: Secondary | ICD-10-CM | POA: Diagnosis not present

## 2021-05-25 DIAGNOSIS — L814 Other melanin hyperpigmentation: Secondary | ICD-10-CM | POA: Diagnosis not present

## 2021-05-25 DIAGNOSIS — D2371 Other benign neoplasm of skin of right lower limb, including hip: Secondary | ICD-10-CM | POA: Diagnosis not present

## 2021-05-25 DIAGNOSIS — L570 Actinic keratosis: Secondary | ICD-10-CM | POA: Diagnosis not present

## 2021-05-25 DIAGNOSIS — D2271 Melanocytic nevi of right lower limb, including hip: Secondary | ICD-10-CM | POA: Diagnosis not present

## 2021-06-03 ENCOUNTER — Ambulatory Visit (INDEPENDENT_AMBULATORY_CARE_PROVIDER_SITE_OTHER): Payer: PPO | Admitting: Nurse Practitioner

## 2021-06-03 ENCOUNTER — Telehealth: Payer: Self-pay | Admitting: Interventional Cardiology

## 2021-06-03 ENCOUNTER — Encounter: Payer: Self-pay | Admitting: Nurse Practitioner

## 2021-06-03 VITALS — BP 98/64 | HR 137 | Ht 69.0 in | Wt 156.0 lb

## 2021-06-03 DIAGNOSIS — I341 Nonrheumatic mitral (valve) prolapse: Secondary | ICD-10-CM

## 2021-06-03 DIAGNOSIS — R0602 Shortness of breath: Secondary | ICD-10-CM

## 2021-06-03 DIAGNOSIS — I4892 Unspecified atrial flutter: Secondary | ICD-10-CM

## 2021-06-03 DIAGNOSIS — R000 Tachycardia, unspecified: Secondary | ICD-10-CM

## 2021-06-03 DIAGNOSIS — R079 Chest pain, unspecified: Secondary | ICD-10-CM | POA: Diagnosis not present

## 2021-06-03 MED ORDER — APIXABAN 5 MG PO TABS: 5.0000 mg | ORAL_TABLET | Freq: Two times a day (BID) | ORAL | 11 refills | Status: DC

## 2021-06-03 MED ORDER — METOPROLOL SUCCINATE ER 25 MG PO TB24: 25.0000 mg | ORAL_TABLET | Freq: Every evening | ORAL | 11 refills | Status: DC

## 2021-06-03 NOTE — Progress Notes (Signed)
Cardiology Office Note:    Date:  06/03/2021   ID:  Jay Ayers, DOB Feb 27, 1945, MRN [id number]  PCP:  Shon Baton, MD   [facility] Providers Cardiologist:  Sinclair Grooms, MD     Referring MD: Shon Baton, MD   Chief Complaint: elevated HR and dizziness  History of Present Illness:    Jay Ayers is a very pleasant 76 y.o. male with a hx of vague chest discomfort, dyspnea, LVEF of 55 to 60% and grade 1 diastolic dysfunction, normal coronary arteries by cath 01/14/2019 with LVEDP 21 mmHg, sinus bradycardia, palpitations, and mitral valve prolapse.   He also saw Dr. Virgina Jock for a second opinion who had similar clinical diagnosis as Dr. Tamala Julian.  He wore a cardiac monitor 03/2019 which showed some SVT and NSVT so he was started on Toprol.   He was last seen in our office by Dr. Tamala Julian on 11/09/20 at which time metoprolol was discontinued due to bradycardia and orthostatic dizziness. He was advised to follow-up as needed.  He called our office on 06/03/21 to report he became a little short of breath and lightheaded when walking his dog this morning.  Checked BP when he got home which was low normal, heart rate was elevated in the 120s to 130s which is usually 50-60 bpm so appointment with me for today was made. He is here alone today for his visit. He is reading a book when I enter the room. I explained the findings of his EKG which revealed atrial flutter at 137 bpm. He reports that he noted faster HR today and that BP was lower than normal.  He denies palpitations, chest discomfort, presyncope, syncope. We discussed potential triggers of a fib. His wife had a stroke approximately 3 weeks ago, however she is doing well.   Past Medical History:  Diagnosis Date   Arthritis    knees, back , shoulders. hx. Polycythermia Rheumatica   Clotting disorder ([place])    "tends to bleed easily"   GERD (gastroesophageal reflux disease)    Memory disturbance 08/06/2013   evaluated- memory appears  to be worsening- no meds helped   Mitral valve prolapse    Neuromuscular disorder (HCC)    PMR (polymyalgia rheumatica) (HCC)    Prostate cancer (HCC)    Weakness of back     Past Surgical History:  Procedure Laterality Date   [place]   COLONOSCOPY WITH PROPOFOL N/A 06/02/2015   Procedure: COLONOSCOPY WITH PROPOFOL;  Surgeon: Garlan Fair, MD;  Location: [facility];  Service: Endoscopy;  Laterality: N/A;   EYE SURGERY     "cyst on eye excised"   LEFT HEART CATH AND CORONARY ANGIOGRAPHY N/A 01/14/2019   Procedure: LEFT HEART CATH AND CORONARY ANGIOGRAPHY;  Surgeon: Belva Crome, MD;  Location: [place] [facility];  Service: Cardiovascular;  Laterality: N/A;   MINOR NAILBED REPAIR Right 04/25/2013   Procedure: RIGHT THUMB NAIL BIOPSY    (MINOR PROCEDURE);  Surgeon: Cammie Sickle., MD;  Location: [facility];  Service: Orthopedics;  Laterality: Right;  clean class   PILONIDAL CYST EXCISION     PROSTATE SURGERY  2005   ROTATOR CUFF REPAIR Right 2002   SPINE SURGERY     only epidural injection in back after lumbar surgery several yrs ago   [place]  Current Medications: Current Meds  Medication Sig   apixaban (ELIQUIS) 5 MG TABS tablet Take 1 tablet (5 mg total) by mouth 2 (two) times daily.   metoprolol succinate (TOPROL XL) 25 MG 24 hr tablet Take 1 tablet (25 mg total) by mouth every evening.   Multiple Vitamin (MULTIVITAMIN) tablet Take 1 tablet by mouth daily.   omeprazole (PRILOSEC) 20 MG capsule Take 20 mg by mouth every other day.     Allergies:   Nitrofurantoin   Social History   Socioeconomic History   Marital status: Married    Spouse name: Not on file   Number of children: 5   Years of education: BS   Highest education level: Not on file  Occupational History   Occupation: Retired  Tobacco Use   Smoking status: Never   Smokeless  tobacco: Never  Substance and Sexual Activity   Alcohol use: Yes    Comment: occ   Drug use: No   Sexual activity: Not on file  Other Topics Concern   Not on file  Social History Narrative   Lives   Patient is right handed.   Patient drinks 3 cups caffeine daily.   Social Determinants of Health   Financial Resource Strain: Not on file  Food Insecurity: Not on file  Transportation Needs: Not on file  Physical Activity: Not on file  Stress: Not on file  Social Connections: Not on file     Family History: The patient's family history includes Emphysema in his father; Heart Problems in his brother and another family member; Heart failure in his mother; Leukemia in his brother; Multiple sclerosis in his brother and another family member; Prostate cancer in his brother and another family member; Stroke in his mother.  ROS:   Please see the history of present illness.    +dizziness +fast heart rate All other systems reviewed and are negative.  Labs/Other Studies Reviewed:    The following studies were reviewed today:  LHC 01/14/19  Normal left main Widely patent LAD with 2 focal areas of mid LAD systolic compression to less than 25%. Normal circumflex Mild diffuse disease in RCA without obstructive disease. LVEDP 21 mmHg.  EF 55%.  Findings consistent with chronic diastolic dysfunction/heart failure.   RECOMMENDATIONS:   No obstructive disease to account for patient's exertional symptoms.  There is always the possibility of microvascular disease. Diastolic dysfunction documented by the study. Potential therapeutic considerations would be the addition of low-dose ARB therapy and or beta-blocker therapy.  Cardiac monitor 03/21/19  NSR NSVT with 28 beat episode averaging 161 bpm. SVT episode lasting 29 beats at 116 bpm PVC burden 1.3 % PAC burden < 1%   Echo 09/19/18  1. The left ventricle has normal systolic function, with an ejection  fraction of 55-60%. The cavity  size was normal. Left ventricular diastolic  Doppler parameters are consistent with impaired relaxation. No evidence of  left ventricular regional wall  motion abnormalities.   2. The right ventricle has normal systolic function. The cavity was  normal. There is no increase in right ventricular wall thickness.   3. Mild mitral valve prolapse.   4. The mitral valve is myxomatous. Mild thickening of the mitral valve  leaflet.   5. The aortic valve is tricuspid. Mild thickening of the aortic valve.  Aortic valve regurgitation is mild by color flow Doppler.   6. The aorta is normal unless otherwise noted.   Recent Labs: No results found for requested labs within last  8760 hours.  Recent Lipid Panel    Component Value Date/Time   CHOL 118 09/28/2018 0901   TRIG 76 09/28/2018 0901   HDL 33 (L) 09/28/2018 0901   CHOLHDL 3.6 09/28/2018 0901   LDLCALC 70 09/28/2018 0901    Risk Assessment/Calculations:    CHA2DS2-VASc Score = 3   This indicates a 3.2% annual risk of stroke. The patient's score is based upon: CHF History: 1 HTN History: 0 Diabetes History: 0 Stroke History: 0 Vascular Disease History: 0 Age Score: 2 Gender Score: 0    Physical Exam:    VS:  BP 98/64   Pulse (!) 137   Ht '5\' 9"'$  (1.753 m)   Wt 156 lb (70.8 kg)   SpO2 97%   BMI 23.04 kg/m     Wt Readings from Last 3 Encounters:  06/03/21 156 lb (70.8 kg)  11/09/20 152 lb 6.4 oz (69.1 kg)  10/07/19 156 lb (70.8 kg)     GEN:  Well nourished, well developed in no acute distress HEENT: Normal NECK: No JVD; No carotid bruits CARDIAC: Irregular RR, no murmurs, rubs, gallops RESPIRATORY:  Clear to auscultation without rales, wheezing or rhonchi  ABDOMEN: Soft, non-tender, non-distended MUSCULOSKELETAL:  No edema; No deformity. 2+ pedal pulses, equal bilaterally SKIN: Warm and dry NEUROLOGIC:  Alert and oriented x 3 PSYCHIATRIC:  Normal affect   EKG:  EKG is ordered today.  The ekg ordered today  demonstrates atrial flutter at 137 bpm  Diagnoses:    1. Mitral valve prolapse   2. SOB (shortness of breath)   3. Increased heart rate   4. Atrial flutter, unspecified type ([place])    Assessment and Plan:     New onset atrial flutter: Called office today due to elevated heart rate and shortness of breath.  EKG revealed atrial flutter at 137 bpm. No palpitations, he does not feel particularly symptomatic. No presyncope, syncope. I spent greater than 20 minutes discussing treatment options with him.  He will start Eliquis 5 mg twice daily. We will get CBC, magnesium TSH, and BMET for evaluation of contributing factors.  He would like to start Toprol 25 mg daily due to soft BP with plan to increase if HR does not decrease over the next few days. Initial plan is to proceed with DCCV, however upon further discussion he is hopeful that medication will return him to sinus rhythm.  I advised him to monitor HR, BP, and for signs of CHF. ER precautions given and call back with concerning symptoms. We will plan for him to see A-fib clinic provider in 3 weeks for further discussion of treatment options.   Chronic HFpEF: Mild shortness of breath recently, likely in the setting of new onset atrial flutter.  No evidence of volume overload today.  Advised him to monitor for increased shortness of breath, weight gain, edema, orthopnea. We will reassess at next office visit.   Mitral valve prolapse: Mitral valve prolapse with mild leak by echo 09/2018.  Not specifically addressed at this visit.     Disposition: 3 weeks with A Fib Clinic  Medication Adjustments/Labs and Tests Ordered: Current medicines are reviewed at length with the patient today.  Concerns regarding medicines are outlined above.  Orders Placed This Encounter  Procedures   Basic metabolic panel   CBC   Magnesium   TSH   Amb Referral to AFIB Clinic   EKG 12-Lead   Meds ordered this encounter  Medications   apixaban (ELIQUIS) 5 MG TABS  tablet    Sig: Take 1 tablet (5 mg total) by mouth 2 (two) times daily.    Dispense:  60 tablet    Refill:  11   metoprolol succinate (TOPROL XL) 25 MG 24 hr tablet    Sig: Take 1 tablet (25 mg total) by mouth every evening.    Dispense:  30 tablet    Refill:  11    Patient Instructions  Medication Instructions:  Your physician has recommended you make the following change in your medication:   START Eliqius 5 mg taking 1 twice a day.  IT IS IMPORTANT YOU DO NOT MISS A DOSE OF THIS MEDICATION.  START Toprol XL 25 mg taking 1 every evening  *If you need a refill on your cardiac medications before your next appointment, please call your pharmacy*   Lab Work: TODAY:  BMET, CBC, MAG, & TSH  If you have labs (blood work) drawn today and your tests are completely normal, you will receive your results only by: [place] (if you have MyChart) OR A paper copy in the mail If you have any lab test that is abnormal or we need to change your treatment, we will call you to review the results.   Testing/Procedures: None ordered  You have been referred to A-Fib Clinic (needs to be seen in 3 weeks)    Follow-Up: At [facility], you and your health needs are our priority.  As part of our continuing mission to provide you with exceptional heart care, we have created designated Provider Care Teams.  These Care Teams include your primary Cardiologist (physician) and Advanced Practice Providers (APPs -  Physician Assistants and Nurse Practitioners) who all work together to provide you with the care you need, when you need it.  We recommend signing up for the patient portal called "MyChart".  Sign up information is provided on this After Visit Summary.  MyChart is used to connect with patients for Virtual Visits (Telemedicine).  Patients are able to view lab/test results, encounter notes, upcoming appointments, etc.  Non-urgent messages can be sent to your provider as well.   To learn more  about what you can do with MyChart, go to [email or web address].    Your next appointment:   3 week(s)  The format for your next appointment:   In Person  Provider:   You will follow up in the [facility] located at [facility]. Your provider will be: Roderic Palau, NP or Clint R. Marlene Lard, PA-C    Other Instructions   Important Information About Sugar         Signed, Emmaline Life, NP  06/03/2021 5:47 PM    [facility]

## 2021-06-03 NOTE — Telephone Encounter (Signed)
STAT if HR is under 50 or over 120 (normal HR is 60-100 beats per minute)  What is your heart rate? 131  Do you have a log of your heart rate readings (document readings)? 131, 132    Do you have any other symptoms? Dizzy, lightheaded and SOB

## 2021-06-03 NOTE — Patient Instructions (Signed)
Medication Instructions:  Your physician has recommended you make the following change in your medication:   START Eliqius 5 mg taking 1 twice a day.  IT IS IMPORTANT YOU DO NOT MISS A DOSE OF THIS MEDICATION.  START Toprol XL 25 mg taking 1 every evening  *If you need a refill on your cardiac medications before your next appointment, please call your pharmacy*   Lab Work: TODAY:  BMET, CBC, MAG, & TSH  If you have labs (blood work) drawn today and your tests are completely normal, you will receive your results only by: [place] (if you have MyChart) OR A paper copy in the mail If you have any lab test that is abnormal or we need to change your treatment, we will call you to review the results.   Testing/Procedures: None ordered  You have been referred to A-Fib Clinic (needs to be seen in 3 weeks)    Follow-Up: At [facility], you and your health needs are our priority.  As part of our continuing mission to provide you with exceptional heart care, we have created designated Provider Care Teams.  These Care Teams include your primary Cardiologist (physician) and Advanced Practice Providers (APPs -  Physician Assistants and Nurse Practitioners) who all work together to provide you with the care you need, when you need it.  We recommend signing up for the patient portal called "MyChart".  Sign up information is provided on this After Visit Summary.  MyChart is used to connect with patients for Virtual Visits (Telemedicine).  Patients are able to view lab/test results, encounter notes, upcoming appointments, etc.  Non-urgent messages can be sent to your provider as well.   To learn more about what you can do with MyChart, go to [email or web address].    Your next appointment:   3 week(s)  The format for your next appointment:   In Person  Provider:   You will follow up in the [facility] located at [place]. Your provider will be: Roderic Palau, NP or Clint R. Fenton, PA-C    Other Instructions   Important Information About Sugar

## 2021-06-03 NOTE — Telephone Encounter (Signed)
Pt was out walking his dog this am and became a little SOB and a little lightheaded. Checked BP when got back home: 96/60, HR 128 85/65 HR 123  50-60 is his usual HR and has been consistently in the 130s since then. BP 95-110/60s  Feels little headache, little SOB.  Not aware of hard or fast HR.  Usually can count carotid pulse but it is too faint for him to feel.   Reviewed with Dr. Tamala Julian who is in agreement w appointment today.  Reviewed ER precautions w patient for if symptoms changes/worsen in the meantime.  Pt in agreement and grateful for assistance.

## 2021-06-04 ENCOUNTER — Telehealth: Payer: Self-pay | Admitting: Nurse Practitioner

## 2021-06-04 LAB — TSH: TSH: 1.56 u[IU]/mL (ref 0.450–4.500)

## 2021-06-04 LAB — CBC
Hematocrit: 46.6 % (ref 37.5–51.0)
Hemoglobin: 15.4 g/dL (ref 13.0–17.7)
MCH: 30.5 pg (ref 26.6–33.0)
MCHC: 33 g/dL (ref 31.5–35.7)
MCV: 92 fL (ref 79–97)
Platelets: 269 10*3/uL (ref 150–450)
RBC: 5.05 x10E6/uL (ref 4.14–5.80)
RDW: 11.9 % (ref 11.6–15.4)
WBC: 6 10*3/uL (ref 3.4–10.8)

## 2021-06-04 LAB — MAGNESIUM: Magnesium: 2.5 mg/dL — ABNORMAL HIGH (ref 1.6–2.3)

## 2021-06-04 LAB — BASIC METABOLIC PANEL
BUN/Creatinine Ratio: 16 (ref 10–24)
BUN: 16 mg/dL (ref 8–27)
CO2: 26 mmol/L (ref 20–29)
Calcium: 9.3 mg/dL (ref 8.6–10.2)
Chloride: 97 mmol/L (ref 96–106)
Creatinine, Ser: 1.03 mg/dL (ref 0.76–1.27)
Glucose: 99 mg/dL (ref 70–99)
Potassium: 4.8 mmol/L (ref 3.5–5.2)
Sodium: 136 mmol/L (ref 134–144)
eGFR: 75 mL/min/{1.73_m2} (ref >59)

## 2021-06-04 NOTE — Telephone Encounter (Signed)
Will you please call this patient and find out how he is feeling and what his heart rate is running? I saw him yesterday for new onset atrial fibrillation/flutter and started him on a low-dose of Toprol due to his low blood pressure.  I would like to know what his heart rate and BP are today so that he does not go through the weekend with fast heart rate.

## 2021-06-04 NOTE — Telephone Encounter (Signed)
Spoke with patient and he reports blood pressure range of 113-119/50-60 and heart rate of 50's-60's today. He does feel a little tired and out of breath today. He wanted to know if it is okay to drink a few cups of coffee daily as well as if should be limiting his physical activity. Spoke with Christen Bame, NP and she does not want him to be sedentary and encourages that he do light activity such as walking or chores around the house as tolerated but nothing that is strenuous. She asks that he limit his coffee to one cup a day. I have spoke to patient and provided him with answers to his questions as well as reiterate that he should watch for swelling and/or shortness of breath or fast heart rates as discussed at his office visit yesterday and he should report to the ED if he experiences this. He verbalized his understanding and agrees with this plan.

## 2021-06-04 NOTE — Telephone Encounter (Signed)
Left patient a message to call us back.

## 2021-06-12 IMAGING — CT CT HEAR MORPH WITH CTA COR WITH SCORE WITH CA WITH CONTRAST AND
1 series · 1 of 1 positions shown · non-contrast
Comparison: None.

Addendum:
CLINICAL DATA: 73-year-old male with h/o dyspnea on exertion.

EXAM:
Cardiac/Coronary  CTA
TECHNIQUE: The patient was scanned on a [employer] Force scanner.

[Series 1: topogram · coronal · 1.00mm/px · 1 of 1 slices shown]
[im 1/1]
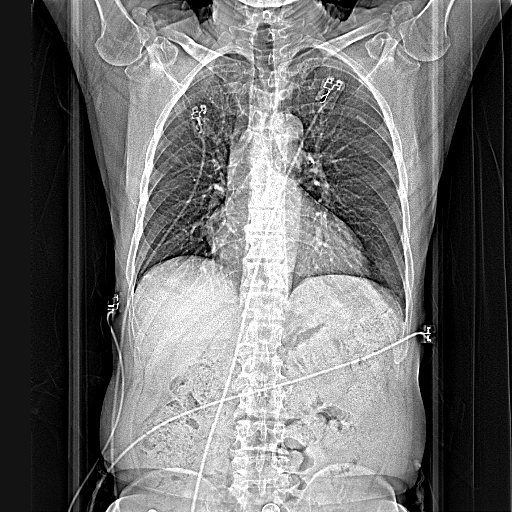

[1 of 1 positions shown; findings below may reference images not displayed]

FINDINGS: A 100 kV prospective scan was triggered in the descending thoracic
aorta at 111 HU's. Axial non-contrast 3 mm slices were carried out
through the heart. The data set was analyzed on a dedicated work
station and scored using the Agatson method. Gantry rotation speed
was 250 msecs and collimation was .6 mm. 50 mg of PO metoprolol and
0.8 mg of sl NTG was given. The 3D data set was reconstructed in 5%
intervals of the 67-82 % of the R-R cycle. Diastolic phases were
analyzed on a dedicated work station using MPR, MIP and VRT modes.
The patient received 80 cc of contrast.

Aorta: Normal size. Trivial atherosclerotic plaque. No dissection.

Aortic Valve:  Trileaflet.  No calcifications.

Coronary Arteries:  Normal coronary origin.  Right dominance.

RCA is a large dominant artery that gives rise to PDA and PLA. There
is minimal calcified plaque in the proximal RCA with stenosis 0-25%.

Left main is a large artery that gives rise to LAD and LCX arteries.
Left main has no plaque.

LAD is a large vessel that gives rise to one small diagonal artery.
LAD has minimal calcified plaque in the proximal portion with
stenosis 0-25%.

LCX is a non-dominant artery that gives rise to one large OM1
branch. There are minimal irregularities.

Other findings:

Normal pulmonary vein drainage into the left atrium.

Normal left atrial appendage without a thrombus.

Normal size of the pulmonary artery.
IMPRESSION: 1. Coronary calcium score of 79. This was 20 percentile for age and
sex matched control.

2. Normal coronary origin with right dominance.

3. Minimal non-obstructive CAD (0-24%). Consider non-atherosclerotic
causes of chest pain. Consider preventive therapy and risk factor
modification.

EXAM:
OVER-READ INTERPRETATION  CT CHEST

The following report is an over-read performed by radiologist Dr.
does not include interpretation of cardiac or coronary anatomy or
pathology. The coronary calcium score/coronary CTA interpretation by
the cardiologist is attached.
FINDINGS: The visualized portions of the lower lung fields show no suspicious
nodules, masses, or infiltrates. The visualized portions of the
mediastinum and chest wall are unremarkable.
IMPRESSION: No significant non-cardiovascular abnormality seen in visualized
portion of the thorax.

*** End of Addendum ***
FINDINGS: A 100 kV prospective scan was triggered in the descending thoracic
aorta at 111 HU's. Axial non-contrast 3 mm slices were carried out
through the heart. The data set was analyzed on a dedicated work
station and scored using the Agatson method. Gantry rotation speed
was 250 msecs and collimation was .6 mm. 50 mg of PO metoprolol and
0.8 mg of sl NTG was given. The 3D data set was reconstructed in 5%
intervals of the 67-82 % of the R-R cycle. Diastolic phases were
analyzed on a dedicated work station using MPR, MIP and VRT modes.
The patient received 80 cc of contrast.

Aorta: Normal size. Trivial atherosclerotic plaque. No dissection.

Aortic Valve:  Trileaflet.  No calcifications.

Coronary Arteries:  Normal coronary origin.  Right dominance.

RCA is a large dominant artery that gives rise to PDA and PLA. There
is minimal calcified plaque in the proximal RCA with stenosis 0-25%.

Left main is a large artery that gives rise to LAD and LCX arteries.
Left main has no plaque.

LAD is a large vessel that gives rise to one small diagonal artery.
LAD has minimal calcified plaque in the proximal portion with
stenosis 0-25%.

LCX is a non-dominant artery that gives rise to one large OM1
branch. There are minimal irregularities.

Other findings:

Normal pulmonary vein drainage into the left atrium.

Normal left atrial appendage without a thrombus.

Normal size of the pulmonary artery.
IMPRESSION: 1. Coronary calcium score of 79. This was 20 percentile for age and
sex matched control.

2. Normal coronary origin with right dominance.

3. Minimal non-obstructive CAD (0-24%). Consider non-atherosclerotic
causes of chest pain. Consider preventive therapy and risk factor
modification.

## 2021-06-24 ENCOUNTER — Ambulatory Visit (HOSPITAL_COMMUNITY)
Admission: RE | Admit: 2021-06-24 | Discharge: 2021-06-24 | Disposition: A | Discharge status: Home / Self Care | Payer: PPO | Source: Ambulatory Visit | Attending: Nurse Practitioner | Admitting: Nurse Practitioner

## 2021-06-24 ENCOUNTER — Encounter (HOSPITAL_COMMUNITY): Payer: Self-pay | Admitting: Nurse Practitioner

## 2021-06-24 VITALS — BP 156/74 | HR 50 | Ht 69.0 in | Wt 157.6 lb

## 2021-06-24 DIAGNOSIS — I483 Typical atrial flutter: Secondary | ICD-10-CM | POA: Diagnosis not present

## 2021-06-24 DIAGNOSIS — R0789 Other chest pain: Secondary | ICD-10-CM | POA: Insufficient documentation

## 2021-06-24 DIAGNOSIS — Z8249 Family history of ischemic heart disease and other diseases of the circulatory system: Secondary | ICD-10-CM | POA: Insufficient documentation

## 2021-06-24 DIAGNOSIS — Z79899 Other long term (current) drug therapy: Secondary | ICD-10-CM | POA: Insufficient documentation

## 2021-06-24 DIAGNOSIS — D6869 Other thrombophilia: Secondary | ICD-10-CM

## 2021-06-24 DIAGNOSIS — I4891 Unspecified atrial fibrillation: Secondary | ICD-10-CM | POA: Diagnosis not present

## 2021-06-24 DIAGNOSIS — R0609 Other forms of dyspnea: Secondary | ICD-10-CM | POA: Diagnosis not present

## 2021-06-24 DIAGNOSIS — I4892 Unspecified atrial flutter: Secondary | ICD-10-CM | POA: Insufficient documentation

## 2021-06-24 MED ORDER — METOPROLOL SUCCINATE ER 25 MG PO TB24: 12.5000 mg | ORAL_TABLET | Freq: Every evening | ORAL | 11 refills | Status: DC

## 2021-06-24 NOTE — Patient Instructions (Signed)
Decrease metoprolol to 1/2 tablet once a day

## 2021-06-24 NOTE — Progress Notes (Signed)
Primary Care Physician: Shon Baton, MD Referring Physician: Christen Bame, NP   Jay Ayers is a 76 y.o. male with a h/o  hx of vague chest discomfort, dyspnea, LVEF of 55 to 60% and grade 1 diastolic dysfunction, normal coronary arteries by cath 01/14/2019 with LVEDP 21 mmHg, sinus bradycardia, palpitations, and mitral valve prolapse. He was last seen by Dr. Tamala Julian on 11/09/20 at which time metoprolol was discontinued due to bradycardia and orthostatic dizziness. He was advised to follow-up as needed  He saw Florene Glen, NP 06/03/21 for racing heart and feeling unwell after walking dog. EKG showed typical atrial flutter at 130 bpm. He was started on Toprol 25 mg at night and eliquis for a CHA2DS2VASc  score of  2 and referred to the afib clinic.   He is in SR today with a HR of 50. He felt he returned to rhythm the next day. He feels that since being on metoprolol and trying to be active, he is more short of breath. He does have chronic shortness of breath and and atypical chest pain for which Dr. Tamala Julian evaluated him in January of 2021. I feel possibly the bradycardia may be confounding his baseline  exertional shortness of breath and will decrease the dose. Since he appears to have been in typical atrial flutter , by ekg on 6/1, and expressing a desire not to be on daily BB or anticoagulation,"bleeds easily",  I will refer to Dr. Lovena Le to discuss typical atrial flutter ablation.    Today, he denies symptoms of palpitations, orthopnea, PND, lower extremity edema, dizziness, presyncope, syncope, or neurologic sequela. The patient is tolerating medications without difficulties and is otherwise without complaint today.   Past Medical History:  Diagnosis Date   Arthritis    knees, back , shoulders. hx. Polycythermia Rheumatica   Clotting disorder ([place])    "tends to bleed easily"   GERD (gastroesophageal reflux disease)    Memory disturbance 08/06/2013   evaluated- memory appears to be  worsening- no meds helped   Mitral valve prolapse    Neuromuscular disorder (HCC)    PMR (polymyalgia rheumatica) (HCC)    Prostate cancer (HCC)    Weakness of back    Past Surgical History:  Procedure Laterality Date   [place]   COLONOSCOPY WITH PROPOFOL N/A 06/02/2015   Procedure: COLONOSCOPY WITH PROPOFOL;  Surgeon: Garlan Fair, MD;  Location: [facility];  Service: Endoscopy;  Laterality: N/A;   EYE SURGERY     "cyst on eye excised"   LEFT HEART CATH AND CORONARY ANGIOGRAPHY N/A 01/14/2019   Procedure: LEFT HEART CATH AND CORONARY ANGIOGRAPHY;  Surgeon: Belva Crome, MD;  Location: [facility];  Service: Cardiovascular;  Laterality: N/A;   MINOR NAILBED REPAIR Right 04/25/2013   Procedure: RIGHT THUMB NAIL BIOPSY    (MINOR PROCEDURE);  Surgeon: Cammie Sickle., MD;  Location: [facility];  Service: Orthopedics;  Laterality: Right;  clean class   PILONIDAL CYST EXCISION     PROSTATE SURGERY  2005   ROTATOR CUFF REPAIR Right 2002   SPINE SURGERY     only epidural injection in back after lumbar surgery several yrs ago   [place]    Current Outpatient Medications  Medication Sig Dispense Refill   acetaminophen (TYLENOL) 500 MG tablet Take 500 mg by mouth at bedtime  as needed for headache.     apixaban (ELIQUIS) 5 MG TABS tablet Take 1 tablet (5 mg total) by mouth 2 (two) times daily. 60 tablet 11   meloxicam (MOBIC) 15 MG tablet Take 15 mg by mouth daily as needed for pain.     Multiple Vitamin (MULTIVITAMIN) tablet Take 1 tablet by mouth daily.     nitroGLYCERIN (NITROSTAT) 0.4 MG SL tablet Place 1 tablet (0.4 mg total) under the tongue every 5 (five) minutes as needed for chest pain. 90 tablet 3   omeprazole (PRILOSEC) 20 MG capsule Take 20 mg by mouth every other day.     metoprolol succinate (TOPROL XL) 25 MG 24 hr tablet Take 0.5 tablets  (12.5 mg total) by mouth every evening. 30 tablet 11   No current facility-administered medications for this encounter.    Allergies  Allergen Reactions   Nitrofurantoin Rash    Social History   Socioeconomic History   Marital status: Married    Spouse name: Not on file   Number of children: 5   Years of education: BS   Highest education level: Not on file  Occupational History   Occupation: Retired  Tobacco Use   Smoking status: Never   Smokeless tobacco: Never  Substance and Sexual Activity   Alcohol use: Yes    Comment: occ   Drug use: No   Sexual activity: Not on file  Other Topics Concern   Not on file  Social History Narrative   Lives   Patient is right handed.   Patient drinks 3 cups caffeine daily.   Social Determinants of Health   Financial Resource Strain: Not on file  Food Insecurity: Not on file  Transportation Needs: Not on file  Physical Activity: Not on file  Stress: Not on file  Social Connections: Not on file  Intimate Partner Violence: Not on file    Family History  Problem Relation Age of Onset   Stroke Mother    Heart failure Mother    Emphysema Father    Multiple sclerosis Brother    Heart Problems Brother    Prostate cancer Brother    Leukemia Brother    Multiple sclerosis Other    Prostate cancer Other    Heart Problems Other     ROS- All systems are reviewed and negative except as per the HPI above  Physical Exam: Vitals:   06/24/21 0916  BP: (!) 156/74  Pulse: (!) 50  Weight: 71.5 kg  Height: '5\' 9"'$  (1.753 m)   Wt Readings from Last 3 Encounters:  06/24/21 71.5 kg  06/03/21 70.8 kg  11/09/20 69.1 kg    Labs: Lab Results  Component Value Date   NA 136 06/03/2021   K 4.8 06/03/2021   CL 97 06/03/2021   CO2 26 06/03/2021   GLUCOSE 99 06/03/2021   BUN 16 06/03/2021   CREATININE 1.03 06/03/2021   CALCIUM 9.3 06/03/2021   MG 2.5 (H) 06/03/2021   No results found for: "INR" Lab Results  Component Value Date    CHOL 118 09/28/2018   HDL 33 (L) 09/28/2018   LDLCALC 70 09/28/2018   TRIG 76 09/28/2018     GEN- The patient is well appearing, alert and oriented x 3 today.   Head- normocephalic, atraumatic Eyes-  Sclera clear, conjunctiva pink Ears- hearing intact Oropharynx- clear Neck- supple, no JVP Lymph- no cervical lymphadenopathy Lungs- Clear to ausculation bilaterally, normal work of breathing Heart- Regular rate and rhythm, no murmurs, rubs  or gallops, PMI not laterally displaced GI- soft, NT, ND, + BS Extremities- no clubbing, cyanosis, or edema MS- no significant deformity or atrophy Skin- no rash or lesion Psych- euthymic mood, full affect Neuro- strength and sensation are intact  EKG-EKG form 6/1 reviewed and shows typical atrial flutter at 137 bpm     Assessment and Plan:  1. New onset typical atrial flutter  General discussion re atrial flutter  Triggers discussed Converted in 24-48 hours of onset with BB on board Feels more short of breath with exertion with taking  toprol 25 mg daily   with a HR of 50 bpm I will reduce BB by 1/2 tab I will refer to Dr. Lovena Le to see if candidate for typical atrial flutter ablation   2. CHA2DS2VASc  score of 2 For now continue eliquis 5 mg bid as will need to be on board for 3 weeks without missing any dose prior to ablation and 4 weeks after that and then if determined by Dr. Lovena Le, can stop drug   3. Exertional  dyspnea Chronic with h/o diastolic dysfunction, by feels worse on bb Cut dose by 1/2  Update echo   4. Atypical chest pain Chronic LHC in 2021 did not show any  significant CAD   Appointment requested with  Dr. Smitty Knudsen C. Eliazar Olivar, [facility] [address] [place], [place] [address] [phone]

## 2021-07-01 ENCOUNTER — Ambulatory Visit (HOSPITAL_COMMUNITY)
Admission: RE | Admit: 2021-07-01 | Discharge: 2021-07-01 | Disposition: A | Discharge status: Home / Self Care | Payer: PPO | Source: Ambulatory Visit | Attending: Nurse Practitioner | Admitting: Nurse Practitioner

## 2021-07-01 DIAGNOSIS — I4891 Unspecified atrial fibrillation: Secondary | ICD-10-CM

## 2021-07-01 DIAGNOSIS — I341 Nonrheumatic mitral (valve) prolapse: Secondary | ICD-10-CM | POA: Insufficient documentation

## 2021-07-01 LAB — ECHOCARDIOGRAM COMPLETE
Area-P 1/2: 2.95 cm2
S' Lateral: 3 cm

## 2021-07-07 ENCOUNTER — Encounter (HOSPITAL_COMMUNITY): Payer: Self-pay | Admitting: *Deleted

## 2021-07-15 ENCOUNTER — Telehealth (HOSPITAL_COMMUNITY): Payer: Self-pay | Admitting: Nurse Practitioner

## 2021-07-15 NOTE — Telephone Encounter (Signed)
Pt per phone call is describing aches and pains in his joints especially hips. He is questioning if it can be eliquis. He has not had any further atrial flutter. CHA2DS2VASc  score of 2. I think it is reasonable to stop eliquis thru the weekend to see if any improvement in symptoms. He will call the office on Monday to report. He is pending  an appointment with Dr. Lovena Le august 1st to discuss atrial flutter ablation. If improvement then switch to xarelto 20 mg daily (crcl cal is 51.52) can give free 30 day card. If no improvement can try stopping metoprolol for a few days and maybe switching out to cardizem 120 mg daily.

## 2021-07-20 MED ORDER — RIVAROXABAN 20 MG PO TABS: 20.0000 mg | ORAL_TABLET | Freq: Every day | ORAL | 3 refills | Status: DC

## 2021-07-20 NOTE — Telephone Encounter (Signed)
Pt symptoms improved off Eliquis -- per Roderic Palau NP will start Xarelto '20mg'$  once a day with supper. Pt verbalized understanding.

## 2021-07-20 NOTE — Addendum Note (Signed)
Addended by: Juluis Mire on: 07/20/2021 09:12 AM   Modules accepted: Orders

## 2021-07-21 DIAGNOSIS — M25551 Pain in right hip: Secondary | ICD-10-CM | POA: Diagnosis not present

## 2021-07-21 DIAGNOSIS — M25552 Pain in left hip: Secondary | ICD-10-CM | POA: Diagnosis not present

## 2021-07-21 DIAGNOSIS — M25522 Pain in left elbow: Secondary | ICD-10-CM | POA: Diagnosis not present

## 2021-07-21 DIAGNOSIS — M25521 Pain in right elbow: Secondary | ICD-10-CM | POA: Diagnosis not present

## 2021-07-21 DIAGNOSIS — M25512 Pain in left shoulder: Secondary | ICD-10-CM | POA: Diagnosis not present

## 2021-07-21 DIAGNOSIS — I5189 Other ill-defined heart diseases: Secondary | ICD-10-CM | POA: Diagnosis not present

## 2021-07-21 DIAGNOSIS — W57XXXA Bitten or stung by nonvenomous insect and other nonvenomous arthropods, initial encounter: Secondary | ICD-10-CM | POA: Diagnosis not present

## 2021-07-21 DIAGNOSIS — I341 Nonrheumatic mitral (valve) prolapse: Secondary | ICD-10-CM | POA: Diagnosis not present

## 2021-07-21 DIAGNOSIS — M791 Myalgia, unspecified site: Secondary | ICD-10-CM | POA: Diagnosis not present

## 2021-07-21 DIAGNOSIS — M25511 Pain in right shoulder: Secondary | ICD-10-CM | POA: Diagnosis not present

## 2021-07-21 DIAGNOSIS — M353 Polymyalgia rheumatica: Secondary | ICD-10-CM | POA: Diagnosis not present

## 2021-07-26 ENCOUNTER — Telehealth (HOSPITAL_COMMUNITY): Payer: Self-pay | Admitting: *Deleted

## 2021-07-26 NOTE — Telephone Encounter (Signed)
Pt continues with joint pain and is now having daily headaches. Pt is concerned it is related to Xarelto and wishes to stop until he sees Dr. Lovena Le the beginning of August.  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

## 2021-08-06 ENCOUNTER — Encounter: Payer: Self-pay | Admitting: Internal Medicine

## 2021-08-06 ENCOUNTER — Ambulatory Visit: Payer: PPO | Admitting: Internal Medicine

## 2021-08-06 ENCOUNTER — Telehealth: Payer: Self-pay | Admitting: *Deleted

## 2021-08-06 VITALS — BP 132/72 | HR 56 | Ht 69.0 in | Wt 154.0 lb

## 2021-08-06 DIAGNOSIS — R0683 Snoring: Secondary | ICD-10-CM | POA: Diagnosis not present

## 2021-08-06 DIAGNOSIS — I4892 Unspecified atrial flutter: Secondary | ICD-10-CM | POA: Diagnosis not present

## 2021-08-06 DIAGNOSIS — R002 Palpitations: Secondary | ICD-10-CM | POA: Diagnosis not present

## 2021-08-06 NOTE — Patient Instructions (Addendum)
Medication Instructions:  Your physician recommends that you continue on your current medications as directed. Please refer to the Current Medication list given to you today.  *If you need a refill on your cardiac medications before your next appointment, please call your pharmacy*  Lab Work: None ordered. If you have labs (blood work) drawn today and your tests are completely normal, you will receive your results only by: [place] (if you have MyChart) OR A paper copy in the mail If you have any lab test that is abnormal or we need to change your treatment, we will call you to review the results.  Testing/Procedures: HOME SLEEP STUDY ordered by DR Lovena Le.   Follow-Up: PLEASE schedule a follow up appointment with DR Lovena Le in 6 Months.    To learn more about what you can do with MyChart, go to [email or web address].    Your next appointment:    The format for your next appointment:   In Person  Provider:   Cristopher Peru, MD{or one of the following Advanced Practice Providers on your designated Care Team:   Tommye Standard, [place] Legrand Como "[name]" Chalmers Cater, [place]   Important Information About Sugar

## 2021-08-06 NOTE — Progress Notes (Signed)
HPI Jay Ayers is referred by Roderic Palau for evaluation of atrial flutter. He is a very active 76 yo man with a h/o atrial flutter. He has been treated with a beta blocker and eliquis and had return of normal rhythm spontaneously. He has occasional dyspnea on exertion and his LVEDP at left heart cath was 21, 2 years ago. He notes that he was found to have lyme disease around the time that he developed atrial flutter. He did not require DCCV. He has stopped taking the eliquis. He underwent a full treatment of doxycycline.  Allergies  Allergen Reactions   Nitrofurantoin Rash     Current Outpatient Medications  Medication Sig Dispense Refill   acetaminophen (TYLENOL) 500 MG tablet Take 500 mg by mouth at bedtime as needed for headache.     meloxicam (MOBIC) 15 MG tablet Take 15 mg by mouth daily as needed for pain.     metoprolol succinate (TOPROL XL) 25 MG 24 hr tablet Take 0.5 tablets (12.5 mg total) by mouth every evening. 30 tablet 11   Multiple Vitamin (MULTIVITAMIN) tablet Take 1 tablet by mouth daily.     omeprazole (PRILOSEC) 20 MG capsule Take 20 mg by mouth every other day.     No current facility-administered medications for this visit.     Past Medical History:  Diagnosis Date   Arthritis    knees, back , shoulders. hx. Polycythermia Rheumatica   Clotting disorder ([place])    "tends to bleed easily"   GERD (gastroesophageal reflux disease)    Memory disturbance 08/06/2013   evaluated- memory appears to be worsening- no meds helped   Mitral valve prolapse    Neuromuscular disorder (HCC)    PMR (polymyalgia rheumatica) (HCC)    Prostate cancer (HCC)    Weakness of back     ROS:   All systems reviewed and negative except as noted in the HPI.   Past Surgical History:  Procedure Laterality Date   [place]   COLONOSCOPY WITH PROPOFOL N/A 06/02/2015   Procedure: COLONOSCOPY WITH PROPOFOL;  Surgeon: Garlan Fair, MD;  Location:  [facility];  Service: Endoscopy;  Laterality: N/A;   EYE SURGERY     "cyst on eye excised"   LEFT HEART CATH AND CORONARY ANGIOGRAPHY N/A 01/14/2019   Procedure: LEFT HEART CATH AND CORONARY ANGIOGRAPHY;  Surgeon: Belva Crome, MD;  Location: [place] [facility];  Service: Cardiovascular;  Laterality: N/A;   MINOR NAILBED REPAIR Right 04/25/2013   Procedure: RIGHT THUMB NAIL BIOPSY    (MINOR PROCEDURE);  Surgeon: Cammie Sickle., MD;  [facility]: [facility];  Service: Orthopedics;  Laterality: Right;  clean class   PILONIDAL CYST EXCISION     PROSTATE SURGERY  2005   ROTATOR CUFF REPAIR Right 2002   SPINE SURGERY     only epidural injection in back after lumbar surgery several yrs ago   [place]     Family History  Problem Relation Age of Onset   Stroke Mother    Heart failure Mother    Emphysema Father    Multiple sclerosis Brother    Heart Problems Brother    Prostate cancer Brother    Leukemia Brother    Multiple sclerosis Other    Prostate cancer Other    Heart Problems Other  Social History   Socioeconomic History   Marital status: Married    Spouse name: Not on file   Number of children: 5   Years of education: BS   Highest education level: Not on file  Occupational History   Occupation: Retired  Tobacco Use   Smoking status: Never   Smokeless tobacco: Never  Substance and Sexual Activity   Alcohol use: Yes    Comment: occ   Drug use: No   Sexual activity: Not on file  Other Topics Concern   Not on file  Social History Narrative   Lives   Patient is right handed.   Patient drinks 3 cups caffeine daily.   Social Determinants of Health   Financial Resource Strain: Not on file  Food Insecurity: Not on file  Transportation Needs: Not on file  Physical Activity: Not on file  Stress: Not on file  Social Connections: Not on file  Intimate Partner Violence:  Not on file     BP 132/72   Pulse (!) 56   Ht '5\' 9"'$  (1.753 m)   Wt 154 lb (69.9 kg)   BMI 22.74 kg/m   Physical Exam:  Well appearing NAD HEENT: Unremarkable Neck:  No JVD, no thyromegally Lymphatics:  No adenopathy Back:  No CVA tenderness Lungs:  Clear with no wheezes HEART:  Regular brady rhythm, no murmurs, no rubs, no clicks Abd:  soft, positive bowel sounds, no organomegally, no rebound, no guarding Ext:  2 plus pulses, no edema, no cyanosis, no clubbing Skin:  No rashes no nodules Neuro:  CN II through XII intact, motor grossly intact  EKG - sinus brady with rare PVC   Assess/Plan:  Atrial flutter - He has had a single episode and will most likely have more. I offered him catheter ablation but also not and also offered him restart of eliquis. He is not inclined to take the eliquis. I encouraged him to obtain a cardio mobile or Apple watch and or to check his pulse daily and call if the HR is above 100/min.  Diastolic dysfunction - he had an elevated EDP at heart cath and has mild dyspnea. I'll defer to Dr. Tamala Julian though I encouraged a low sodium diet. Jay Overlie Lavon Horn,MD

## 2021-08-06 NOTE — Telephone Encounter (Signed)
Pt seen in the office today by Dr. Lovena Le and has been ordered an [place] study. Pt agreeable to signed waiver and not open the box until he has been called and given PIN#

## 2021-08-19 DIAGNOSIS — M2550 Pain in unspecified joint: Secondary | ICD-10-CM | POA: Diagnosis not present

## 2021-08-19 DIAGNOSIS — I4892 Unspecified atrial flutter: Secondary | ICD-10-CM | POA: Diagnosis not present

## 2021-08-19 DIAGNOSIS — R0602 Shortness of breath: Secondary | ICD-10-CM | POA: Diagnosis not present

## 2021-08-20 ENCOUNTER — Telehealth: Payer: Self-pay | Admitting: Internal Medicine

## 2021-08-20 NOTE — Telephone Encounter (Signed)
New Message:    Patient says he needs to talk to Arbie Cookey, this concerning something about his Sleep.

## 2021-08-20 NOTE — Telephone Encounter (Signed)
Pt reports received an approval letter for sleep study.  Was told by Arbie Cookey that would receive a call from our office telling pt when to start test.  Advised pt Arbie Cookey is not in the office today.  Gave pt the code per [facility].  Advised to call back with questions or concerns.

## 2021-08-20 NOTE — Telephone Encounter (Signed)
Left a message to call back.

## 2021-08-23 ENCOUNTER — Encounter (INDEPENDENT_AMBULATORY_CARE_PROVIDER_SITE_OTHER): Payer: PPO | Admitting: Cardiology

## 2021-08-23 DIAGNOSIS — I4892 Unspecified atrial flutter: Secondary | ICD-10-CM | POA: Diagnosis not present

## 2021-08-23 NOTE — Telephone Encounter (Signed)
Prior Authorization for [facility] sent to [facility] via web portal. Tracking Number APPROVED -AUTH#  [id number] Dates: 08/12/2021 - 11/10/2021.

## 2021-08-25 ENCOUNTER — Telehealth: Payer: Self-pay

## 2021-08-25 ENCOUNTER — Ambulatory Visit: Payer: PPO | Attending: Internal Medicine

## 2021-08-25 DIAGNOSIS — R0683 Snoring: Secondary | ICD-10-CM

## 2021-08-25 NOTE — Procedures (Signed)
    SLEEP STUDY REPORT Patient Information Study Date: 08/23/21 Patient Name: Jay Ayers Patient ID: [id number] Birth Date: Sep 05, 2045 Age: 76 Gender: Male BMI: 22.9 (W=154 lb, H=5' 9'') Referring Physician: Cristopher Peru, MD  TEST DESCRIPTION: Home sleep apnea testing was completed using the WatchPat, a Type 1 device, utilizing  peripheral arterial tonometry (PAT), chest movement, actigraphy, pulse oximetry, pulse rate, body position and snore.  AHI was calculated with apnea and hypopnea using valid sleep time as the denominator. RDI includes apneas,  hypopneas, and RERAs. The data acquired and the scoring of sleep and all associated events were performed in  accordance with the recommended standards and specifications as outlined in the AASM Manual for the Scoring of  Sleep and Associated Events 2.2.0 (2015).   FINDINGS: 1. No evidence of Obstructive Sleep Apnea with AHI 2/hr.  2. No Central Sleep Apnea. 3. Oxygen desaturations as low as 92%. 4. Minimal snoring was present. O2 sats were < 88% for 0 minutes. 5. Total sleep time was 6 hrs and 38 min. 6. 14.9% of total sleep time was spent in REM sleep.  7. Normal sleep onset latency at 19 min.  8. Prolonged REM sleep onset latency at 258 min.  9. Total awakenings were 9.   DIAGNOSIS:  Normal study with no significant sleep disordered breathing.  RECOMMENDATIONS: 1. Normal study with no significant sleep disordered breathing.  2. Healthy sleep recommendations include: adequate nightly sleep (normal 7-9 hrs/night), avoidance of caffeine after  noon and alcohol near bedtime, and maintaining a sleep environment that is cool, dark and quiet.  3. Weight loss for overweight patients is recommended.   4. Snoring recommendations include: weight loss where appropriate, side sleeping, and avoidance of alcohol before  bed.  5. Operation of motor vehicle or dangerous equipment must be avoided when feeling drowsy, excessively sleepy,  or  mentally fatigued.   6. An ENT consultation which may be useful for specific causes of and possible treatment of bothersome snoring .   7. Weight loss may be of benefit in reducing the severity of snoring.   Signature: Fransico Him, MD; [facility]; [place], [place] Board of Sleep  Medicine Electronically Signed: 08/25/21

## 2021-08-25 NOTE — Telephone Encounter (Signed)
Pt did have a question about a-fib and device to track/monitor for a-fib. Pt states he was told about a couple of devices. Pt states though the problem is that he does not have a smart phone, states he has an Android, nor does he have a smart watch. Pt wants to know what kind of device can he get to monitor a-fib. I assured the pt that I will send a message to Dr. Tanna Furry nurse to reach out to him with what device he may be able to use. Pt thanked me for the help.

## 2021-08-25 NOTE — Telephone Encounter (Signed)
Returned call to Pt.  We discussed Kardia mobile and smart watches for monitoring heart rhythm, but Pt declines.  He will monitor his pulse daily and call if he notes a pulse greater than 100.  All questions answered.

## 2021-08-25 NOTE — Telephone Encounter (Signed)
S/w the pt today and he has been made aware he is approved for the sleep study. PIN # [id number] was given today. Pt will do sleep study. Pt states he called the office on Friday and was given the PIN # [id number] then. Pt states she has done completed his sleep study and received results.   Pt did have a question about a-fib and device to track/monitor for a-fib. Pt states he was told about a couple of devices. Pt states though the problem is that he does not have a smart phone, states he has an Android, nor does he have a smart watch. Pt wants to know what kind of device can he get to monitor a-fib. I assured the pt that I will send a message to Dr. Tanna Furry nurse to reach out to him with what device he may be able to use. Pt thanked me for the help.

## 2021-08-31 DIAGNOSIS — M2550 Pain in unspecified joint: Secondary | ICD-10-CM | POA: Diagnosis not present

## 2021-08-31 DIAGNOSIS — K30 Functional dyspepsia: Secondary | ICD-10-CM | POA: Diagnosis not present

## 2021-08-31 DIAGNOSIS — I4892 Unspecified atrial flutter: Secondary | ICD-10-CM | POA: Diagnosis not present

## 2021-08-31 DIAGNOSIS — M353 Polymyalgia rheumatica: Secondary | ICD-10-CM | POA: Diagnosis not present

## 2021-08-31 DIAGNOSIS — I5189 Other ill-defined heart diseases: Secondary | ICD-10-CM | POA: Diagnosis not present

## 2021-08-31 DIAGNOSIS — D692 Other nonthrombocytopenic purpura: Secondary | ICD-10-CM | POA: Diagnosis not present

## 2021-08-31 DIAGNOSIS — C61 Malignant neoplasm of prostate: Secondary | ICD-10-CM | POA: Diagnosis not present

## 2021-08-31 DIAGNOSIS — R0789 Other chest pain: Secondary | ICD-10-CM | POA: Diagnosis not present

## 2021-08-31 DIAGNOSIS — R0602 Shortness of breath: Secondary | ICD-10-CM | POA: Diagnosis not present

## 2021-08-31 DIAGNOSIS — R079 Chest pain, unspecified: Secondary | ICD-10-CM | POA: Diagnosis not present

## 2021-08-31 DIAGNOSIS — I341 Nonrheumatic mitral (valve) prolapse: Secondary | ICD-10-CM | POA: Diagnosis not present

## 2021-08-31 DIAGNOSIS — R600 Localized edema: Secondary | ICD-10-CM | POA: Diagnosis not present

## 2021-09-01 ENCOUNTER — Telehealth: Payer: Self-pay | Admitting: *Deleted

## 2021-09-01 NOTE — Telephone Encounter (Signed)
The patient has been notified of the result and verbalized understanding.  All questions (if any) were answered. Jay Ayers, [name] 09/01/2021 4:41 PM    Pt is aware and agreeable to normal results. Patient declines further testing and will speak to his cardiologist at his next appointment.

## 2021-09-01 NOTE — Telephone Encounter (Signed)
-----   Message from Lauralee Evener, [place] sent at 08/25/2021 10:02 AM EDT -----  ----- Message ----- From: Sueanne Margarita, MD Sent: 08/25/2021   8:35 AM EDT To: [facility] Sleep Studies  Normal home sleep study so in lab PSG will be ordered

## 2021-09-12 NOTE — Progress Notes (Unsigned)
Cardiology Office Note:    Date:  09/13/2021   ID:  Jay Ayers, DOB 06-28-1945, MRN [id number]  PCP:  Shon Baton, MD  Cardiologist:  Sinclair Grooms, MD   Referring MD: Shon Baton, MD   Chief Complaint  Patient presents with   Chest Pain   Advice Only    Near syncope    History of Present Illness:    Jay Ayers is a 76 y.o. male with a hx of vague chest discomfort, atrial flutter without cardioversion, dyspnea, LVEF of 55-60% and grade 1 DD, cath 01/14/19 revealing essentially normal coronaries, LVEDP 21 mmHg.  and EF 55%.    Appointment was set because the patient has noticed increasing dyspnea on exertion and chest tightness with physical activity.  Prior evaluation demonstrated minimal plaque without obstructive disease.  He feels his problems have been getting progressively worse.  Before the set appointment for chest discomfort, he had an episode where after walking his usual a.m. route where he became sweaty, developed nausea, and realized that his heart rate and blood pressure were low.  He nearly fainted.  He did lose bladder and bowel continence.  He does not feel that he fainted.  The entire episode lasted less than 5 minutes.  The prodrome was present for 1 to 2 minutes.  He never got in the supine position.  He simply slumped over the table in his kitchen and things gradually got better.  Past Medical History:  Diagnosis Date   Arthritis    knees, back , shoulders. hx. Polycythermia Rheumatica   Clotting disorder ([place])    "tends to bleed easily"   GERD (gastroesophageal reflux disease)    Memory disturbance 08/06/2013   evaluated- memory appears to be worsening- no meds helped   Mitral valve prolapse    Neuromuscular disorder (HCC)    PMR (polymyalgia rheumatica) (HCC)    Prostate cancer (HCC)    Weakness of back     Past Surgical History:  Procedure Laterality Date   [place]   COLONOSCOPY WITH PROPOFOL N/A  06/02/2015   Procedure: COLONOSCOPY WITH PROPOFOL;  Surgeon: Garlan Fair, MD;  Location: [facility];  Service: Endoscopy;  Laterality: N/A;   EYE SURGERY     "cyst on eye excised"   LEFT HEART CATH AND CORONARY ANGIOGRAPHY N/A 01/14/2019   Procedure: LEFT HEART CATH AND CORONARY ANGIOGRAPHY;  Surgeon: Belva Crome, MD;  Location: [facility];  Service: Cardiovascular;  Laterality: N/A;   MINOR NAILBED REPAIR Right 04/25/2013   Procedure: RIGHT THUMB NAIL BIOPSY    (MINOR PROCEDURE);  Surgeon: Cammie Sickle., MD;  Location: [facility];  Service: Orthopedics;  Laterality: Right;  clean class   PILONIDAL CYST EXCISION     PROSTATE SURGERY  2005   ROTATOR CUFF REPAIR Right 2002   SPINE SURGERY     only epidural injection in back after lumbar surgery several yrs ago   [place]    Current Medications: Current Meds  Medication Sig   acetaminophen (TYLENOL) 500 MG tablet Take 500 mg by mouth at bedtime as needed for headache.   meloxicam (MOBIC) 15 MG tablet Take 15 mg by mouth daily as needed for pain.   Multiple Vitamin (MULTIVITAMIN) tablet Take 1 tablet by mouth daily.   omeprazole (PRILOSEC) 20 MG capsule Take 20  mg by mouth every other day.   predniSONE (DELTASONE) 2.5 MG tablet Take by mouth.   [DISCONTINUED] metoprolol succinate (TOPROL XL) 25 MG 24 hr tablet Take 0.5 tablets (12.5 mg total) by mouth every evening.     Allergies:   Nitrofurantoin   Social History   Socioeconomic History   Marital status: Married    Spouse name: Not on file   Number of children: 5   Years of education: BS   Highest education level: Not on file  Occupational History   Occupation: Retired  Tobacco Use   Smoking status: Never   Smokeless tobacco: Never  Substance and Sexual Activity   Alcohol use: Yes    Comment: occ   Drug use: No   Sexual activity: Not on file  Other Topics Concern    Not on file  Social History Narrative   Lives   Patient is right handed.   Patient drinks 3 cups caffeine daily.   Social Determinants of Health   Financial Resource Strain: Not on file  Food Insecurity: Not on file  Transportation Needs: Not on file  Physical Activity: Not on file  Stress: Not on file  Social Connections: Not on file     Family History: The patient's family history includes Emphysema in his father; Heart Problems in his brother and another family member; Heart failure in his mother; Leukemia in his brother; Multiple sclerosis in his brother and another family member; Prostate cancer in his brother and another family member; Stroke in his mother.  ROS:   Please see the history of present illness.    No new medications for supplements.  Never had prior history of syncope.  All other systems reviewed and are negative.  EKGs/Labs/Other Studies Reviewed:    The following studies were reviewed today:   Continuous monitor 03/2019: Study Highlights  NSR NSVT with 28 beat episode averaging 161 bpm. SVT episode lasting 29 beats at 116 bpm PVC burden 1.3 % PAC burden < 1%      Echocardiogram 07/01/2021: IMPRESSIONS     1. Left ventricular ejection fraction, by estimation, is 60 to 65%. The  left ventricle has normal function. The left ventricle has no regional  wall motion abnormalities. Left ventricular diastolic parameters were  normal.   2. Right ventricular systolic function is normal. The right ventricular  size is normal. There is normal pulmonary artery systolic pressure.   3. Left atrial size was mildly dilated.   4. Right atrial size was moderately dilated.   5. The mitral valve is abnormal. Trivial mitral valve regurgitation. No  evidence of mitral stenosis.   6. The aortic valve is tricuspid. There is mild calcification of the  aortic valve. Aortic valve regurgitation is trivial. Aortic valve  sclerosis is present, with no evidence of aortic  valve stenosis.   7. The inferior vena cava is normal in size with greater than 50%  respiratory variability, suggesting right atrial pressure of 3 mmHg.   EKG:  EKG Sinus rhythm, rightward axis, RSR prime V1.  When compared to prior tracing from August 06, 2021, no changes noted.  A single isolated PVC was noted on the last EKG.  Recent Labs: 06/03/2021: BUN 16; Creatinine, Ser 1.03; Hemoglobin 15.4; Magnesium 2.5; Platelets 269; Potassium 4.8; Sodium 136; TSH 1.560  Recent Lipid Panel    Component Value Date/Time   CHOL 118 09/28/2018 0901   TRIG 76 09/28/2018 0901   HDL 33 (L) 09/28/2018 0901   CHOLHDL 3.6  09/28/2018 0901   LDLCALC 70 09/28/2018 0901    Physical Exam:    VS:  BP (!) 122/58   Pulse 62   Ht '5\' 9"'$  (1.753 m)   Wt 155 lb (70.3 kg)   SpO2 96%   BMI 22.89 kg/m     Wt Readings from Last 3 Encounters:  09/13/21 155 lb (70.3 kg)  08/06/21 154 lb (69.9 kg)  06/24/21 157 lb 9.6 oz (71.5 kg)     GEN: Slender. No acute distress HEENT: Normal NECK: No JVD. LYMPHATICS: No lymphadenopathy CARDIAC: No murmur. RRR no gallop, or edema. VASCULAR:  Normal Pulses. No bruits. RESPIRATORY:  Clear to auscultation without rales, wheezing or rhonchi  ABDOMEN: Soft, non-tender, non-distended, No pulsatile mass, MUSCULOSKELETAL: No deformity  SKIN: Warm and dry NEUROLOGIC:  Alert and oriented x 3 PSYCHIATRIC:  Normal affect   ASSESSMENT:    1. Chest pain, unspecified type   2. Vasovagal near syncope   3. Chronic diastolic heart failure ([place])   4. Atrial flutter, unspecified type ([place])   5. Secondary hypercoagulable state ([place])   6. Mitral valve prolapse    PLAN:    In order of problems listed above:  Exercise treadmill test. Discussed the prodrome and instructed the patient to lie down with legs elevated should this recur.  Discontinue Toprol-XL of which she is only taking 12.5 mg daily.  May decide to discuss this with Dr. Lovena Le. Denies orthopnea and PND.  No  changes or additions The patient thought the recent near fainting episode was flutter but his heart rate and blood pressure were both low not elevated.  Suspect the recent episode was a vasovagal described above. Not currently on anticoagulation Quiescent  Further recommendations will be dependent upon findings from the stress test.  If he has recurrence of neurally mediated syncope, we may have him see EP.  I have decided to discontinue the very low-dose metoprolol.  He feels that this helped to suppress palpitations 2 years ago.  We will see how he does off of the minuscule dose that he is currently taking.   Medication Adjustments/Labs and Tests Ordered: Current medicines are reviewed at length with the patient today.  Concerns regarding medicines are outlined above.  Orders Placed This Encounter  Procedures   EXERCISE TOLERANCE TEST (ETT)   EKG 12-Lead   No orders of the defined types were placed in this encounter.   Patient Instructions  Medication Instructions:  Your physician has recommended you make the following change in your medication:   1) STOP metoprolol succinate (Toprol XL)  *If you need a refill on your cardiac medications before your next appointment, please call your pharmacy*  Lab Work: NONE  Testing/Procedures: Your physician has requested that you have an exercise tolerance test. For further information please visit [email or web address]. Please also follow instruction sheet, as given.  Follow-Up: Will be based on results of exercise tolerance test.  Important Information About Sugar         Signed, Sinclair Grooms, MD  09/13/2021 5:41 PM    [facility]

## 2021-09-13 ENCOUNTER — Telehealth: Payer: Self-pay | Admitting: Internal Medicine

## 2021-09-13 ENCOUNTER — Ambulatory Visit: Payer: PPO | Attending: Interventional Cardiology | Admitting: Interventional Cardiology

## 2021-09-13 ENCOUNTER — Encounter: Payer: Self-pay | Admitting: Interventional Cardiology

## 2021-09-13 VITALS — BP 122/58 | HR 62 | Ht 69.0 in | Wt 155.0 lb

## 2021-09-13 DIAGNOSIS — I4892 Unspecified atrial flutter: Secondary | ICD-10-CM | POA: Diagnosis not present

## 2021-09-13 DIAGNOSIS — R079 Chest pain, unspecified: Secondary | ICD-10-CM | POA: Diagnosis not present

## 2021-09-13 DIAGNOSIS — D6869 Other thrombophilia: Secondary | ICD-10-CM | POA: Diagnosis not present

## 2021-09-13 DIAGNOSIS — I341 Nonrheumatic mitral (valve) prolapse: Secondary | ICD-10-CM | POA: Diagnosis not present

## 2021-09-13 DIAGNOSIS — R55 Syncope and collapse: Secondary | ICD-10-CM

## 2021-09-13 DIAGNOSIS — I5032 Chronic diastolic (congestive) heart failure: Secondary | ICD-10-CM | POA: Diagnosis not present

## 2021-09-13 NOTE — Telephone Encounter (Signed)
Patient c/o Palpitations:  High priority if patient c/o lightheadedness, shortness of breath, or chest pain  How long have you had palpitations/irregular HR/ Afib? Are you having the symptoms now?  1 episode of afib yesterday. No symptoms currently.  Are you currently experiencing lightheadedness, SOB or CP?  Not currently  Do you have a history of afib (atrial fibrillation) or irregular heart rhythm?  Yes   Have you checked your BP or HR? (document readings if available):  States he tried to take BP, doesn't remember reading. Was around 62/??. Diastolic was very low  Are you experiencing any other symptoms?  Lightheadedness and nausea yesterday

## 2021-09-13 NOTE — Telephone Encounter (Signed)
Called patient back about his message. Patient complaining of an episode of A. FIB yesterday. Patient stated he had gone for a walk yesterday morning and came back to the house feeling dizzy, and he had not eaten breakfast yet. Patient stated he felt so bad that he took his BP/HR, he cannot remember what his whole BP and HR was, but his SBP was 95. Patient stated he had to put his head down at the tablet and at that time he lost control of his bladder. Patient stated this lasted for about 10 to 15 minutes. His BP slowly came back up 101/51, 108/53, and HR 50's. Today his BP is 119/60 and HR 55. Patient stated he was told to call if he ever has any A. FIB episode. Patient has an appointment with Dr. Tamala Julian this afternoon, but he wants to let Dr. Lovena Le know what is going on and Roderic Palau NP at the A.Fib clinic know as well. Will send message to providers so they are aware.

## 2021-09-13 NOTE — Patient Instructions (Signed)
Medication Instructions:  Your physician has recommended you make the following change in your medication:   1) STOP metoprolol succinate (Toprol XL)  *If you need a refill on your cardiac medications before your next appointment, please call your pharmacy*  Lab Work: NONE  Testing/Procedures: Your physician has requested that you have an exercise tolerance test. For further information please visit [email or web address]. Please also follow instruction sheet, as given.  Follow-Up: Will be based on results of exercise tolerance test.  Important Information About Sugar

## 2021-09-16 NOTE — Telephone Encounter (Signed)
Noted.GT

## 2021-09-20 ENCOUNTER — Telehealth (HOSPITAL_COMMUNITY): Payer: Self-pay | Admitting: *Deleted

## 2021-09-20 NOTE — Telephone Encounter (Signed)
Patient continues to have similar symptoms to his office visit with Dr. Tamala Julian on 9/11. Intermittent CP with left arm (at elbow) pain, shortness of breath with exertion and feels he has intermittent skip beats. Pt stopped metoprolol as instructed but after a few days decided to resume the medication but his symptoms haven't really improved. Pt feels his shortness of breath has worsened since appt with Dr. Tamala Julian. Pt also very concerned regarding treadmill test as he does not feel he can tolerate exercise. Informed pt I would forward to heartcare office for advice. Pt requested a call from a provider if possible but explained this may not be possible at this time. Best contact number is [phone]

## 2021-09-21 NOTE — Telephone Encounter (Signed)
Patient was calling back to speak to [facility], but she states that he needs to speak to Dr. Tamala Julian nurse. Please advise

## 2021-09-21 NOTE — Telephone Encounter (Signed)
Returned call to patient.  Patient states he has been experiencing worsening SOB and chest tightness since he last saw Dr. Tamala Julian on 09/13/21.  Patient reports having "a spell" last week on 9/15 or 9/16 (patient unable to recall exact day) where he describes feeling as though he were going to vomit, feeling faint with a  cold sweat and his bladder released. He laid his head down on the kitchen table, the episode lasted about 15 minutes and gradually improved. (Similar to what he reported in phone note on 09/13/21.)  Patient states he continues to have SOB with minimal activity and chest tightness, states he has "missing heartbeats". He started taking his metoprolol again (was discontinued on 09/13/21 at [place]), he states he has been taking this for the last 2-3 days but does not notice any improvements with skipping beats. He noted that when he lies down in bed and elevates his feet this improves.  Patient states he saw Dr. Lovena Le for atrial flutter (OV 08/06/21) and discussed catheter ablation.  Patient also wanted to note that he has noticed the veins on his hands and feet are more raised, he described as "popped up" from the skin, which is new to him.  He is concerned about his ability to complete ETT scheduled for 09/23/21.  Will forward to Dr. Tamala Julian to review and advise.

## 2021-09-23 ENCOUNTER — Ambulatory Visit: Payer: PPO | Attending: Interventional Cardiology

## 2021-09-23 DIAGNOSIS — R079 Chest pain, unspecified: Secondary | ICD-10-CM

## 2021-09-23 DIAGNOSIS — I5032 Chronic diastolic (congestive) heart failure: Secondary | ICD-10-CM

## 2021-09-24 LAB — EXERCISE TOLERANCE TEST
Angina Index: 0
Duke Treadmill Score: 3
Estimated workload: 4.6
Exercise duration (min): 3 min
Exercise duration (sec): 5 s
MPHR: 143 {beats}/min
Peak HR: 99 {beats}/min
Percent HR: 69 %
Rest HR: 59 {beats}/min
ST Depression (mm): 0 mm

## 2021-09-29 ENCOUNTER — Encounter: Payer: Self-pay | Admitting: Interventional Cardiology

## 2021-09-29 NOTE — Telephone Encounter (Signed)
Spoke with pt and advised of ETT results per Dr Tamala Julian as below.  Pt states he did continue Metoprolol until a "couple of days prior to the test and then stopped."  Advised pt he will need a follow up appointment to determine if he does have chronotropic incompetence and would possibly need a pacemaker.  Appointment has been scheduled with Ambrose Pancoast, NP on 10/07/2021 at 1120am.

## 2021-10-05 NOTE — Progress Notes (Deleted)
Office Visit    Patient Name: Jay Ayers Date of Encounter: 10/05/2021  Primary Care Provider:  Shon Baton, MD Primary Cardiologist:  Sinclair Grooms, MD Primary Electrophysiologist: None  Chief Complaint    Jay Ayers is a 76 y.o. male with PMH of MVR, prostate CA, nonobstructive CAD s/p LHC 01/2019, PAF not on NOAC per patient choice, who presents today to discuss results of recent exercise tolerance test.   Past Medical History:  Diagnosis Date   Arthritis    knees, back , shoulders. hx. Polycythermia Rheumatica   Clotting disorder ([place])    "tends to bleed easily"   GERD (gastroesophageal reflux disease)    Memory disturbance 08/06/2013   evaluated- memory appears to be worsening- no meds helped   Mitral valve prolapse    Neuromuscular disorder (HCC)    PMR (polymyalgia rheumatica) (HCC)    Prostate cancer (HCC)    Weakness of back    Past Surgical History:  Procedure Laterality Date   [place]   COLONOSCOPY WITH PROPOFOL N/A 06/02/2015   Procedure: COLONOSCOPY WITH PROPOFOL;  Surgeon: Garlan Fair, MD;  Location: [facility];  Service: Endoscopy;  Laterality: N/A;   EYE SURGERY     "cyst on eye excised"   LEFT HEART CATH AND CORONARY ANGIOGRAPHY N/A 01/14/2019   Procedure: LEFT HEART CATH AND CORONARY ANGIOGRAPHY;  Surgeon: Belva Crome, MD;  Location: [facility];  Service: Cardiovascular;  Laterality: N/A;   MINOR NAILBED REPAIR Right 04/25/2013   Procedure: RIGHT THUMB NAIL BIOPSY    (MINOR PROCEDURE);  Surgeon: Cammie Sickle., MD;  Location: [facility];  Service: Orthopedics;  Laterality: Right;  clean class   PILONIDAL CYST EXCISION     PROSTATE SURGERY  2005   ROTATOR CUFF REPAIR Right 2002   SPINE SURGERY     only epidural injection in back after lumbar surgery several yrs ago   [place]     Allergies  Allergies  Allergen Reactions   Nitrofurantoin Rash    History of Present Illness    Jay Ayers  is a 76 year old male with the above mention past medical history who presents today for discussion of exercise tolerance test and 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  Jay Ayers was initially seen by Dr. Tamala Julian in 2016 for complaint of chest pain and underwent exercise Myoview resulted in low risk with no indications of ischemia.  He was lost to follow-up until 2020 when he presented with shortness of breath and chest pain.  He underwent coronary CTA that revealed plaque with less than 30% stenosis.  2D echo was also completed that revealed EF of 55 to 60%, no RWMA, MV prolapse.  He underwent LHC in 2021 for persistent chest discomfort that revealed normal coronaries with diastolic dysfunction and possible microvascular disease.  He was started on losartan for diastolic dysfunction.  He was seen 1 month later for complaint of palpitations that were intermittent.  He wore a 7-day event monitor that revealed 28 beat episode of NSVT with PVC burden of 1.3%.  He was started on Toprol XL 25 mg at that time.  He was seen in follow-up on 04/2019 with report of improved palpitations on beta-blocker.  He was seen subsequently on 11/2020 with complaints of orthostatic dizziness.  His metoprolol was discontinued at  that time.  He was seen in follow-up on 06/03/2021 by Christen Bame, NP with complaints of shortness of breath and lightheadedness.  Patient's blood pressure at home was in the 120s to 130s.  EKG was completed during visit that showed atrial flutter and patient was started on Eliquis 5 mg and Toprol 25 mg with plan for DCCV however patient elected to defer medication treatment at that time.Marland Kitchen  He was referred to AF clinic for further evaluation.  During his consult visit in AF clinic he was sinus rhythm and heart rate of 50.  Patient expressed desire to not be on anticoagulation or  beta-blockers and was referred to Dr. Lovena Le for further discussion of treatment options.  During his visit with Dr. Lovena Le patient was offered catheter ablation and encouraged him to restart his Eliquis however patient declined restarting Eliquis.  He was referred back to Dr. Tamala Julian due to mild dyspnea.  During his visit with Dr. Tamala Julian on 09/13/2021 he endorsed shortness of breath with exertion no chest tightness with physical activity.  Patient also noted episode of near syncope with loss of bowel and bladder continence.  He was instructed to discontinue Toprol-XL ETT was ordered to evaluate for shortness of breath and chest tightness.  ETT was completed on 09/03/2021 revealing poor heart rate response to exercise with max heart rate of 99 bpm.  Tamala Julian feels that if patient has chronotropic incompetence he would benefit from rate response pacemaker.  Since last being seen in the office patient reports***.  Patient denies chest pain, palpitations, dyspnea, PND, orthopnea, nausea, vomiting, dizziness, syncope, edema, weight gain, or early satiety.     ***Notes:  Home Medications    Current Outpatient Medications  Medication Sig Dispense Refill   acetaminophen (TYLENOL) 500 MG tablet Take 500 mg by mouth at bedtime as needed for headache.     meloxicam (MOBIC) 15 MG tablet Take 15 mg by mouth daily as needed for pain.     Multiple Vitamin (MULTIVITAMIN) tablet Take 1 tablet by mouth daily.     omeprazole (PRILOSEC) 20 MG capsule Take 20 mg by mouth every other day.     predniSONE (DELTASONE) 2.5 MG tablet Take by mouth.     No current facility-administered medications for this visit.     Review of Systems  Please see the history of present illness.    (+)*** (+)***  All other systems reviewed and are otherwise negative except as noted above.  Physical Exam    Wt Readings from Last 3 Encounters:  09/13/21 155 lb (70.3 kg)  08/06/21 154 lb (69.9 kg)  06/24/21 157 lb 9.6 oz (71.5 kg)    BJ:SEGBT were no vitals filed for this visit.,There is no height or weight on file to calculate BMI.  Constitutional:      Appearance: Healthy appearance. Not in distress.  Neck:     Vascular: JVD normal.  Pulmonary:     Effort: Pulmonary effort is normal.     Breath sounds: No wheezing. No rales. Diminished in the bases Cardiovascular:     Normal rate. Regular rhythm. Normal S1. Normal S2.      Murmurs: There is no murmur.  Edema:    Peripheral edema absent.  Abdominal:     Palpations: Abdomen is soft non tender. There is no hepatomegaly.  Skin:    General: Skin is warm and dry.  Neurological:     General: No focal deficit present.     Mental Status: Alert and oriented to  person, place and time.     Cranial Nerves: Cranial nerves are intact.  EKG/LABS/Other Studies Reviewed    ECG personally reviewed by me today - ***  Risk Assessment/Calculations:   {Does this patient have ATRIAL FIBRILLATION?:[phone]}  STOP-Bang Score:  4  { Consider Dx Sleep Disordered Breathing or Sleep Apnea  ICD G47.33          :1}      Lab Results  Component Value Date   WBC 6.0 06/03/2021   HGB 15.4 06/03/2021   HCT 46.6 06/03/2021   MCV 92 06/03/2021   PLT 269 06/03/2021   Lab Results  Component Value Date   CREATININE 1.03 06/03/2021   BUN 16 06/03/2021   NA 136 06/03/2021   K 4.8 06/03/2021   CL 97 06/03/2021   CO2 26 06/03/2021   Lab Results  Component Value Date   ALT 16 09/28/2018   AST 22 09/28/2018   ALKPHOS 103 09/28/2018   BILITOT 0.9 09/28/2018   Lab Results  Component Value Date   CHOL 118 09/28/2018   HDL 33 (L) 09/28/2018   LDLCALC 70 09/28/2018   TRIG 76 09/28/2018   CHOLHDL 3.6 09/28/2018    No results found for: "HGBA1C"  Assessment & Plan    1.***  2.***  3.***  4.***      Disposition: Follow-up with Belva Crome III, MD or APP in *** months {Are you ordering a CV Procedure (e.g. stress test, cath, DCCV, TEE, etc)?   Press F2         :[id number]}   Medication Adjustments/Labs and Tests Ordered: Current medicines are reviewed at length with the patient today.  Concerns regarding medicines are outlined above.   Signed, Mable Fill, Marissa Nestle, NP 10/05/2021, 7:46 PM [facility]  Note:  This document was prepared using Dragon voice recognition software and may include unintentional dictation errors.

## 2021-10-07 ENCOUNTER — Ambulatory Visit: Payer: PPO | Admitting: Nurse Practitioner

## 2021-10-07 ENCOUNTER — Ambulatory Visit: Payer: PPO | Attending: Internal Medicine | Admitting: Internal Medicine

## 2021-10-07 VITALS — BP 116/58 | HR 57 | Ht 69.0 in | Wt 151.0 lb

## 2021-10-07 DIAGNOSIS — R002 Palpitations: Secondary | ICD-10-CM | POA: Diagnosis not present

## 2021-10-07 DIAGNOSIS — I4892 Unspecified atrial flutter: Secondary | ICD-10-CM | POA: Diagnosis not present

## 2021-10-07 NOTE — Patient Instructions (Addendum)
Medication Instructions:  Your physician recommends that you continue on your current medications as directed. Please refer to the Current Medication list given to you today.  *If you need a refill on your cardiac medications before your next appointment, please call your pharmacy*  Lab Work: You will have blood drawn today:  CBC and BMET  Testing/Procedures: None ordered.  Follow-Up:  SEE INSTRUCTION LETTER  Pacemaker Implantation, Adult Pacemaker implantation is a procedure to place a pacemaker inside the chest. A pacemaker is a small computer that sends electrical signals to the heart and helps the heart beat normally. A pacemaker also stores information about heart rhythms. You may need pacemaker implantation if you have: A slow heartbeat (bradycardia). Loss of consciousness that happens repeatedly (syncope) or repeated episodes of dizziness or light-headedness because of an irregular heart rate. Shortness of breath (dyspnea) due to heart problems. The pacemaker usually attaches to your heart through a wire called a lead. One or two leads may be needed. There are different types of pacemakers: Transvenous pacemaker. This type is placed under the skin or muscle of your upper chest area. The lead goes through a vein in the chest area to reach the inside of the heart. Epicardial pacemaker. This type is placed under the skin or muscle of your chest or abdomen. The lead goes through your chest to the outside of the heart. Tell a health care provider about: Any allergies you have. All medicines you are taking, including vitamins, herbs, eye drops, creams, and over-the-counter medicines. Any problems you or family members have had with anesthetic medicines. Any blood or bone disorders you have. Any surgeries you have had. Any medical conditions you have. Whether you are pregnant or may be pregnant. What are the risks? Generally, this is a safe procedure. However, problems may occur,  including: Infection. Bleeding. Failure of the pacemaker or the lead. Collapse of a lung or bleeding into a lung. Blood clot inside a blood vessel with a lead. Damage to the heart. Infection inside the heart (endocarditis). Allergic reactions to medicines. What happens before the procedure? Staying hydrated Follow instructions from your health care provider about hydration, which may include: Up to 2 hours before the procedure - you may continue to drink clear liquids, such as water, clear fruit juice, black coffee, and plain tea.  Eating and drinking restrictions Follow instructions from your health care provider about eating and drinking, which may include: 8 hours before the procedure - stop eating heavy meals or foods, such as meat, fried foods, or fatty foods. 6 hours before the procedure - stop eating light meals or foods, such as toast or cereal. 6 hours before the procedure - stop drinking milk or drinks that contain milk. 2 hours before the procedure - stop drinking clear liquids. Medicines Ask your health care provider about: Changing or stopping your regular medicines. This is especially important if you are taking diabetes medicines or blood thinners. Taking medicines such as aspirin and ibuprofen. These medicines can thin your blood. Do not take these medicines unless your health care provider tells you to take them. Taking over-the-counter medicines, vitamins, herbs, and supplements. Tests You may have: A heart evaluation. This may include: An electrocardiogram (ECG). This involves placing patches on your skin to check your heart rhythm. A chest X-ray. An echocardiogram. This is a test that uses sound waves (ultrasound) to produce an image of the heart. A cardiac rhythm monitor. This is used to record your heart rhythm and any events  for a longer period of time. Blood tests. Genetic testing. General instructions Do not use any products that contain nicotine or tobacco  for at least 4 weeks before the procedure. These products include cigarettes, e-cigarettes, and chewing tobacco. If you need help quitting, ask your health care provider. Ask your health care provider: How your surgery site will be marked. What steps will be taken to help prevent infection. These steps may include: Removing hair at the surgery site. Washing skin with a germ-killing soap. Receiving antibiotic medicine. Plan to have someone take you home from the hospital or clinic. If you will be going home right after the procedure, plan to have someone with you for 24 hours. What happens during the procedure? An IV will be inserted into one of your veins. You will be given one or more of the following: A medicine to help you relax (sedative). A medicine to numb the area (local anesthetic). A medicine to make you fall asleep (general anesthetic). The next steps vary depending on the type of pacemaker you will be getting. If you are getting a transvenous pacemaker: An incision will be made in your upper chest. A pocket will be made for the pacemaker. It may be placed under the skin or between layers of muscle. The lead will be inserted into a blood vessel that goes to the heart. While X-rays are taken by an imaging machine (fluoroscopy), the lead will be advanced through the vein to the inside of your heart. The other end of the lead will be tunneled under the skin and attached to the pacemaker. If you are getting an epicardial pacemaker: An incision will be made near your ribs or breastbone (sternum) for the lead. The lead will be attached to the outside of your heart. Another incision will be made in your chest or upper abdomen to create a pocket for the pacemaker. The free end of the lead will be tunneled under the skin and attached to the pacemaker. The transvenous or epicardial pacemaker will be tested. Imaging studies may be done to check the lead position. The incisions will be  closed with stitches (sutures), adhesive strips, or skin glue. Bandages (dressings) will be placed over the incisions. The procedure may vary among health care providers and hospitals. What happens after the procedure? Your blood pressure, heart rate, breathing rate, and blood oxygen level will be monitored until you leave the hospital or clinic. You may be given antibiotics. You will be given pain medicine. An ECG and chest X-rays will be done. You may need to wear a continuous type of ECG (Holter monitor) to check your heart rhythm. Your health care provider will program the pacemaker. If you were given a sedative during the procedure, it can affect you for several hours. Do not drive or operate machinery until your health care provider says that it is safe. You will be given a pacemaker identification card. This card lists the implant date, device model, and manufacturer of your pacemaker. Summary A pacemaker is a small computer that sends electrical signals to the heart and helps the heart beat normally. There are different types of pacemakers. A pacemaker may be placed under the skin or muscle of your chest or abdomen. Follow instructions from your health care provider about eating and drinking and about taking medicines before the procedure. This information is not intended to replace advice given to you by your health care provider. Make sure you discuss any questions you have with your health care  provider. Document Revised: 09/01/2020 Document Reviewed: 11/21/2018 Elsevier Patient Education  [place].

## 2021-10-07 NOTE — Progress Notes (Signed)
HPI [name] returns today for followup. He is a pleasant 76 yo man with a remote h/o atrial flutter who had developed progressive fatigue and weakness and underwent exercise testing several weeks ago where he went 3 minutes and stopped due to dyspnea and was found to have max HR of 90/min. He has weakness and notes that his HR at home does not get up to 100 and usually much less. He does not have obstructive CAD by cath. He has occaisional palpitations which appear related to PVC's.  Allergies  Allergen Reactions   Nitrofurantoin Rash     Current Outpatient Medications  Medication Sig Dispense Refill   acetaminophen (TYLENOL) 500 MG tablet Take 500 mg by mouth at bedtime as needed for headache.     Multiple Vitamin (MULTIVITAMIN) tablet Take 1 tablet by mouth daily.     predniSONE (DELTASONE) 2.5 MG tablet Take 4 mg by mouth.     meloxicam (MOBIC) 15 MG tablet Take 15 mg by mouth daily as needed for pain. (Patient not taking: Reported on 10/07/2021)     No current facility-administered medications for this visit.     Past Medical History:  Diagnosis Date   Arthritis    knees, back , shoulders. hx. Polycythermia Rheumatica   Clotting disorder ([place])    "tends to bleed easily"   GERD (gastroesophageal reflux disease)    Memory disturbance 08/06/2013   evaluated- memory appears to be worsening- no meds helped   Mitral valve prolapse    Neuromuscular disorder ([facility])    PMR (polymyalgia rheumatica) (HCC)    Prostate cancer (HCC)    Weakness of back     ROS:   All systems reviewed and negative except as noted in the HPI.   Past Surgical History:  Procedure Laterality Date   [place]   COLONOSCOPY WITH PROPOFOL N/A 06/02/2015   Procedure: COLONOSCOPY WITH PROPOFOL;  Surgeon: Jay Fair, MD;  Location: [facility];  Service: Endoscopy;  Laterality: N/A;   EYE SURGERY     "cyst on eye excised"   LEFT HEART CATH AND CORONARY  ANGIOGRAPHY N/A 01/14/2019   Procedure: LEFT HEART CATH AND CORONARY ANGIOGRAPHY;  Surgeon: Jay Crome, MD;  Location: [facility];  Service: Cardiovascular;  Laterality: N/A;   MINOR NAILBED REPAIR Right 04/25/2013   Procedure: RIGHT THUMB NAIL BIOPSY    (MINOR PROCEDURE);  Surgeon: Jay Ayers., MD;  Location: [facility];  Service: Orthopedics;  Laterality: Right;  clean class   PILONIDAL CYST EXCISION     PROSTATE SURGERY  2005   ROTATOR CUFF REPAIR Right 2002   SPINE SURGERY     only epidural injection in back after lumbar surgery several yrs ago   [place]     Family History  Problem Relation Age of Onset   Stroke Mother    Heart failure Mother    Emphysema Father    Multiple sclerosis Brother    Heart Problems Brother    Prostate cancer Brother    Leukemia Brother    Multiple sclerosis Other    Prostate cancer Other    Heart Problems Other      Social History   Socioeconomic History   Marital status: Married    Spouse name: Not on file   Number of children: 5  Years of education: BS   Highest education level: Not on file  Occupational History   Occupation: Retired  Tobacco Use   Smoking status: Never   Smokeless tobacco: Never  Substance and Sexual Activity   Alcohol use: Yes    Comment: occ   Drug use: No   Sexual activity: Not on file  Other Topics Concern   Not on file  Social History Narrative   Lives   Patient is right handed.   Patient drinks 3 cups caffeine daily.   Social Determinants of Health   Financial Resource Strain: Not on file  Food Insecurity: Not on file  Transportation Needs: Not on file  Physical Activity: Not on file  Stress: Not on file  Social Connections: Not on file  Intimate Partner Violence: Not on file     BP (!) 116/58   Pulse (!) 57   Ht '5\' 9"'$  (1.753 m)   Wt 151 lb (68.5 kg)   SpO2 96%   BMI 22.30 kg/m    Physical Exam:  Well appearing NAD HEENT: Unremarkable Neck:  No JVD, no thyromegally Lymphatics:  No adenopathy Back:  No CVA tenderness Lungs:  Clear with no wheezes HEART:  Regular rate rhythm, no murmurs, no rubs, no clicks Abd:  soft, positive bowel sounds, no organomegally, no rebound, no guarding Ext:  2 plus pulses, no edema, no cyanosis, no clubbing Skin:  No rashes no nodules Neuro:  CN II through XII intact, motor grossly intact  EKG - nsr with PVC's  Assess/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 I have discussed the risks/benefits/goals/expectations and he will call us if he wishes to proceed. Dyspnea with exertion - I suspect that this is multifactorial with a component due to his slow HR's. PAF - he appears to be maintaining NSR. HTN - his bp is well controlled. We will follow.  Carleene Overlie Garrette Caine,MD

## 2021-10-07 NOTE — H&P (View-Only) (Signed)
HPI [name] returns today for followup. He is a pleasant 76 yo man with a remote h/o atrial flutter who had developed progressive fatigue and weakness and underwent exercise testing several weeks ago where he went 3 minutes and stopped due to dyspnea and was found to have max HR of 90/min. He has weakness and notes that his HR at home does not get up to 100 and usually much less. He does not have obstructive CAD by cath. He has occaisional palpitations which appear related to PVC's.  Allergies  Allergen Reactions   Nitrofurantoin Rash     Current Outpatient Medications  Medication Sig Dispense Refill   acetaminophen (TYLENOL) 500 MG tablet Take 500 mg by mouth at bedtime as needed for headache.     Multiple Vitamin (MULTIVITAMIN) tablet Take 1 tablet by mouth daily.     predniSONE (DELTASONE) 2.5 MG tablet Take 4 mg by mouth.     meloxicam (MOBIC) 15 MG tablet Take 15 mg by mouth daily as needed for pain. (Patient not taking: Reported on 10/07/2021)     No current facility-administered medications for this visit.     Past Medical History:  Diagnosis Date   Arthritis    knees, back , shoulders. hx. Polycythermia Rheumatica   Clotting disorder ([place])    "tends to bleed easily"   GERD (gastroesophageal reflux disease)    Memory disturbance 08/06/2013   evaluated- memory appears to be worsening- no meds helped   Mitral valve prolapse    Neuromuscular disorder (HCC)    PMR (polymyalgia rheumatica) (HCC)    Prostate cancer (HCC)    Weakness of back     ROS:   All systems reviewed and negative except as noted in the HPI.   Past Surgical History:  Procedure Laterality Date   [place]   COLONOSCOPY WITH PROPOFOL N/A 06/02/2015   Procedure: COLONOSCOPY WITH PROPOFOL;  Surgeon: Garlan Fair, MD;  Location: [facility];  Service: Endoscopy;  Laterality: N/A;   EYE SURGERY     "cyst on eye excised"   LEFT HEART CATH AND CORONARY  ANGIOGRAPHY N/A 01/14/2019   Procedure: LEFT HEART CATH AND CORONARY ANGIOGRAPHY;  Surgeon: Belva Crome, MD;  Location: [facility];  Service: Cardiovascular;  Laterality: N/A;   MINOR NAILBED REPAIR Right 04/25/2013   Procedure: RIGHT THUMB NAIL BIOPSY    (MINOR PROCEDURE);  Surgeon: Cammie Sickle., MD;  Location: [facility];  Service: Orthopedics;  Laterality: Right;  clean class   PILONIDAL CYST EXCISION     PROSTATE SURGERY  2005   ROTATOR CUFF REPAIR Right 2002   SPINE SURGERY     only epidural injection in back after lumbar surgery several yrs ago   [place]     Family History  Problem Relation Age of Onset   Stroke Mother    Heart failure Mother    Emphysema Father    Multiple sclerosis Brother    Heart Problems Brother    Prostate cancer Brother    Leukemia Brother    Multiple sclerosis Other    Prostate cancer Other    Heart Problems Other      Social History   Socioeconomic History   Marital status: Married    Spouse name: Not on file   Number of children: 5  Years of education: BS   Highest education level: Not on file  Occupational History   Occupation: Retired  Tobacco Use   Smoking status: Never   Smokeless tobacco: Never  Substance and Sexual Activity   Alcohol use: Yes    Comment: occ   Drug use: No   Sexual activity: Not on file  Other Topics Concern   Not on file  Social History Narrative   Lives   Patient is right handed.   Patient drinks 3 cups caffeine daily.   Social Determinants of Health   Financial Resource Strain: Not on file  Food Insecurity: Not on file  Transportation Needs: Not on file  Physical Activity: Not on file  Stress: Not on file  Social Connections: Not on file  Intimate Partner Violence: Not on file     BP (!) 116/58   Pulse (!) 57   Ht '5\' 9"'$  (1.753 m)   Wt 151 lb (68.5 kg)   SpO2 96%   BMI 22.30 kg/m    Physical Exam:  Well appearing NAD HEENT: Unremarkable Neck:  No JVD, no thyromegally Lymphatics:  No adenopathy Back:  No CVA tenderness Lungs:  Clear with no wheezes HEART:  Regular rate rhythm, no murmurs, no rubs, no clicks Abd:  soft, positive bowel sounds, no organomegally, no rebound, no guarding Ext:  2 plus pulses, no edema, no cyanosis, no clubbing Skin:  No rashes no nodules Neuro:  CN II through XII intact, motor grossly intact  EKG - nsr with PVC's  Assess/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 I have discussed the risks/benefits/goals/expectations and he will call us if he wishes to proceed. Dyspnea with exertion - I suspect that this is multifactorial with a component due to his slow HR's. PAF - he appears to be maintaining NSR. HTN - his bp is well controlled. We will follow.  Jay Overlie [name],MD

## 2021-10-07 NOTE — Progress Notes (Signed)
2 letters sent to patient through [place]. 1 is incorrect.    The first one, BiV PPM should be disregarded. ( Incorrect. )  The patient is having a dual chamber BIO PPM placed by Dr. Lovena Le on 10/20/21 at 230 pm.

## 2021-10-08 LAB — CBC WITH DIFFERENTIAL/PLATELET
Basophils Absolute: 0 10*3/uL (ref 0.0–0.2)
Basos: 1 %
EOS (ABSOLUTE): 0 10*3/uL (ref 0.0–0.4)
Eos: 0 %
Hematocrit: 43.7 % (ref 37.5–51.0)
Hemoglobin: 14.5 g/dL (ref 13.0–17.7)
Immature Grans (Abs): 0 10*3/uL (ref 0.0–0.1)
Immature Granulocytes: 0 %
Lymphocytes Absolute: 1 10*3/uL (ref 0.7–3.1)
Lymphs: 14 %
MCH: 30.3 pg (ref 26.6–33.0)
MCHC: 33.2 g/dL (ref 31.5–35.7)
MCV: 91 fL (ref 79–97)
Monocytes Absolute: 0.8 10*3/uL (ref 0.1–0.9)
Monocytes: 11 %
Neutrophils Absolute: 5.5 10*3/uL (ref 1.4–7.0)
Neutrophils: 74 %
Platelets: 306 10*3/uL (ref 150–450)
RBC: 4.79 x10E6/uL (ref 4.14–5.80)
RDW: 11.8 % (ref 11.6–15.4)
WBC: 7.4 10*3/uL (ref 3.4–10.8)

## 2021-10-08 LAB — BASIC METABOLIC PANEL
BUN/Creatinine Ratio: 11 (ref 10–24)
BUN: 10 mg/dL (ref 8–27)
CO2: 26 mmol/L (ref 20–29)
Calcium: 9.4 mg/dL (ref 8.6–10.2)
Chloride: 94 mmol/L — ABNORMAL LOW (ref 96–106)
Creatinine, Ser: 0.91 mg/dL (ref 0.76–1.27)
Glucose: 96 mg/dL (ref 70–99)
Potassium: 4.7 mmol/L (ref 3.5–5.2)
Sodium: 133 mmol/L — ABNORMAL LOW (ref 134–144)
eGFR: 87 mL/min/{1.73_m2} (ref >59)

## 2021-10-08 NOTE — Addendum Note (Signed)
Addended by: Sharee Holster R on: 10/08/2021 11:52 AM   Modules accepted: Orders

## 2021-10-11 ENCOUNTER — Encounter: Payer: Self-pay | Admitting: Internal Medicine

## 2021-10-12 ENCOUNTER — Telehealth: Payer: Self-pay | Admitting: Internal Medicine

## 2021-10-12 NOTE — Telephone Encounter (Signed)
Pt c/o medication issue:  1. Name of Medication: predniSONE (DELTASONE) 2.5 MG tablet  2. How are you currently taking this medication (dosage and times per day)? Take 4 mg by mouth. - Oral  3. Are you having a reaction (difficulty breathing--STAT)?  4. What is your medication issue? Pt wants to know if he needs to hold this medication before his upcoming pacemaker implant procedure

## 2021-10-12 NOTE — Telephone Encounter (Signed)
Patient called to inquire if he needs to stop taking prednisone 4 mg daily prior to his PPM insertion. Reviewed pre-op instructions with patient and advised patient he could take his normal medications as directed per his instruction sheet.   I told him I would forward his question to Dr. Lovena Le to verify.   Also reviewed instructions regarding having a caregiver available to care for him the first 24 hours following discharge from hospital after procedure.

## 2021-10-13 ENCOUNTER — Other Ambulatory Visit (HOSPITAL_COMMUNITY): Payer: Self-pay

## 2021-10-13 NOTE — Telephone Encounter (Signed)
Patient is following up on recommendation to stop or reduce his prednisone medication if necessary.

## 2021-10-14 NOTE — Telephone Encounter (Signed)
Pt calling back again or an update. Informed him that Dr. Tanna Furry didn't see an issue with it but is checking with Dr. Lovena Le just in case and is awaiting a response. He was appreciative and will await an answer.

## 2021-10-14 NOTE — Telephone Encounter (Signed)
Returned call to patient. Advised patient he can continue taking Prednisone as prescribed and does not need to hold it prior to PPM insertion 10/20/2021. Patient verbalized understanding.

## 2021-10-18 ENCOUNTER — Encounter: Payer: Self-pay | Admitting: Internal Medicine

## 2021-10-19 NOTE — Pre-Procedure Instructions (Signed)
Attempted to call patient  regarding procedure instructions.  Left voicemail on the following items: Arrival time 1200 Nothing to eat or drink after midnight No meds AM of procedure Responsible person to drive you home and stay with you for 24 hrs Wash with special soap night before and morning of procedure  

## 2021-10-20 ENCOUNTER — Other Ambulatory Visit: Payer: Self-pay

## 2021-10-20 ENCOUNTER — Ambulatory Visit (HOSPITAL_COMMUNITY): Payer: PPO

## 2021-10-20 ENCOUNTER — Ambulatory Visit (HOSPITAL_COMMUNITY)
Admission: RE | Admit: 2021-10-20 | Discharge: 2021-10-20 | Disposition: A | Discharge status: Home / Self Care | Payer: PPO | Attending: Internal Medicine | Admitting: Internal Medicine

## 2021-10-20 ENCOUNTER — Encounter (HOSPITAL_COMMUNITY)
Admission: RE | Disposition: A | Discharge status: Home / Self Care | Payer: Self-pay | Source: Home / Self Care | Attending: Internal Medicine

## 2021-10-20 DIAGNOSIS — I10 Essential (primary) hypertension: Secondary | ICD-10-CM | POA: Diagnosis not present

## 2021-10-20 DIAGNOSIS — R0609 Other forms of dyspnea: Secondary | ICD-10-CM | POA: Insufficient documentation

## 2021-10-20 DIAGNOSIS — I480 Paroxysmal atrial fibrillation: Secondary | ICD-10-CM | POA: Diagnosis not present

## 2021-10-20 DIAGNOSIS — I4589 Other specified conduction disorders: Secondary | ICD-10-CM | POA: Diagnosis not present

## 2021-10-20 DIAGNOSIS — I495 Sick sinus syndrome: Secondary | ICD-10-CM | POA: Diagnosis not present

## 2021-10-20 DIAGNOSIS — Z950 Presence of cardiac pacemaker: Secondary | ICD-10-CM | POA: Diagnosis not present

## 2021-10-20 DIAGNOSIS — R001 Bradycardia, unspecified: Secondary | ICD-10-CM | POA: Diagnosis not present

## 2021-10-20 DIAGNOSIS — J449 Chronic obstructive pulmonary disease, unspecified: Secondary | ICD-10-CM | POA: Diagnosis not present

## 2021-10-20 HISTORY — PX: PACEMAKER IMPLANT: EP1218

## 2021-10-20 SURGERY — PACEMAKER IMPLANT

## 2021-10-20 MED ORDER — SODIUM CHLORIDE 0.9 % IV SOLN
INTRAVENOUS | Status: AC
  Filled 2021-10-20: qty 2

## 2021-10-20 MED ORDER — SODIUM CHLORIDE 0.9 % IV SOLN: INTRAVENOUS | Status: DC

## 2021-10-20 MED ORDER — FENTANYL CITRATE (PF) 100 MCG/2ML IJ SOLN
INTRAMUSCULAR | Status: AC
  Filled 2021-10-20: qty 2

## 2021-10-20 MED ORDER — ONDANSETRON HCL 4 MG/2ML IJ SOLN: 4.0000 mg | Freq: Four times a day (QID) | INTRAMUSCULAR | Status: DC | PRN

## 2021-10-20 MED ORDER — HEPARIN (PORCINE) IN NACL 1000-0.9 UT/500ML-% IV SOLN
INTRAVENOUS | Status: DC | PRN
  Administered 2021-10-20: 500 mL

## 2021-10-20 MED ORDER — ACETAMINOPHEN 325 MG PO TABS: 325.0000 mg | ORAL_TABLET | ORAL | Status: DC | PRN

## 2021-10-20 MED ORDER — CHLORHEXIDINE GLUCONATE 4 % EX LIQD: 4.0000 | Freq: Once | CUTANEOUS | Status: DC

## 2021-10-20 MED ORDER — MIDAZOLAM HCL 5 MG/5ML IJ SOLN
INTRAMUSCULAR | Status: DC | PRN
  Administered 2021-10-20 (×2): 1 mg via INTRAVENOUS

## 2021-10-20 MED ORDER — MIDAZOLAM HCL 5 MG/5ML IJ SOLN
INTRAMUSCULAR | Status: AC
  Filled 2021-10-20: qty 5

## 2021-10-20 MED ORDER — LIDOCAINE HCL (PF) 1 % IJ SOLN
INTRAMUSCULAR | Status: AC
  Filled 2021-10-20: qty 60

## 2021-10-20 MED ORDER — LIDOCAINE HCL (PF) 1 % IJ SOLN
INTRAMUSCULAR | Status: DC | PRN
  Administered 2021-10-20: 60 mL

## 2021-10-20 MED ORDER — HEPARIN (PORCINE) IN NACL 1000-0.9 UT/500ML-% IV SOLN
INTRAVENOUS | Status: AC
  Filled 2021-10-20: qty 500

## 2021-10-20 MED ORDER — POVIDONE-IODINE 10 % EX SWAB
2.0000 | Freq: Once | CUTANEOUS | Status: AC
  Administered 2021-10-20: 2 via TOPICAL

## 2021-10-20 MED ORDER — FENTANYL CITRATE (PF) 100 MCG/2ML IJ SOLN
INTRAMUSCULAR | Status: DC | PRN
  Administered 2021-10-20 (×2): 12.5 ug via INTRAVENOUS

## 2021-10-20 MED ORDER — SODIUM CHLORIDE 0.9 % IV SOLN
80.0000 mg | INTRAVENOUS | Status: AC
  Administered 2021-10-20: 80 mg

## 2021-10-20 MED ORDER — CEFAZOLIN SODIUM-DEXTROSE 2-4 GM/100ML-% IV SOLN
INTRAVENOUS | Status: AC
  Filled 2021-10-20: qty 100

## 2021-10-20 MED ORDER — CEFAZOLIN SODIUM-DEXTROSE 2-4 GM/100ML-% IV SOLN
2.0000 g | INTRAVENOUS | Status: AC
  Administered 2021-10-20: 2 g via INTRAVENOUS

## 2021-10-20 MED ORDER — IOHEXOL 350 MG/ML SOLN
INTRAVENOUS | Status: DC | PRN
  Administered 2021-10-20: 15 mL

## 2021-10-20 SURGICAL SUPPLY — 7 items
CABLE SURGICAL S-101-97-12 (CABLE) ×1 IMPLANT
LEAD SOLIA S PRO MRI 53 (Lead) IMPLANT
LEAD SOLIA S PRO MRI 60 (Lead) IMPLANT
PACEMAKER EDORA 8DR-T MRI (Pacemaker) IMPLANT
PAD DEFIB RADIO PHYSIO CONN (PAD) ×1 IMPLANT
SHEATH 7FR PRELUDE SNAP 13 (SHEATH) IMPLANT
TRAY PACEMAKER INSERTION (PACKS) ×1 IMPLANT

## 2021-10-20 NOTE — Interval H&P Note (Signed)
History and Physical Interval Note:  10/20/2021 12:33 PM  Jay Ayers  has presented today for surgery, with the diagnosis of chronotropic incompetence.  The various methods of treatment have been discussed with the patient and family. After consideration of risks, benefits and other options for treatment, the patient has consented to  Procedure(s): PACEMAKER IMPLANT (N/A) as a surgical intervention.  The patient's history has been reviewed, patient examined, no change in status, stable for surgery.  I have reviewed the patient's chart and labs.  Questions were answered to the patient's satisfaction.     Cristopher Peru

## 2021-10-20 NOTE — Discharge Instructions (Signed)
After Your Pacemaker   You have a [employer] Pacemaker  ACTIVITY Do not lift your arm above shoulder height for 1 week after your procedure. After 7 days, you may progress as below.  You should remove your sling 24 hours after your procedure, unless otherwise instructed by your provider.     Wednesday October 27, 2021  Thursday October 28, 2021 Friday October 29, 2021 Saturday October 30, 2021   Do not lift, push, pull, or carry anything over 10 pounds with the affected arm until 6 weeks (Wednesday December 01, 2021 ) after your procedure.   You may drive AFTER your wound check, unless you have been told otherwise by your provider.   Ask your healthcare provider when you can go back to work   INCISION/Dressing If you are on a blood thinner such as Coumadin, Xarelto, Eliquis, Plavix, or Pradaxa please confirm with your provider when this should be resumed.   If large square, outer bandage is left in place, this can be removed after 24 hours from your procedure. Do not remove steri-strips or glue as below.   Monitor your Pacemaker site for redness, swelling, and drainage. Call the device clinic at [phone] if you experience these symptoms or fever/chills.  If your incision is sealed with Steri-strips or staples, you may shower 7 days after your procedure or when told by your provider. Do not remove the steri-strips or let the shower hit directly on your site. You may wash around your site with soap and water.    If you were discharged in a sling, please do not wear this during the day more than 48 hours after your surgery unless otherwise instructed. This may increase the risk of stiffness and soreness in your shoulder.   Avoid lotions, ointments, or perfumes over your incision until it is well-healed.  You may use a hot tub or a pool AFTER your wound check appointment if the incision is completely closed.  Pacemaker Alerts:  Some alerts are vibratory and others beep. These are NOT  emergencies. Please call our office to let us know. If this occurs at night or on weekends, it can wait until the next business day. Send a remote transmission.  If your device is capable of reading fluid status (for heart failure), you will be offered monthly monitoring to review this with you.   DEVICE MANAGEMENT Remote monitoring is used to monitor your pacemaker from home. This monitoring is scheduled every 91 days by our office. It allows [place] to keep an eye on the functioning of your device to ensure it is working properly. You will routinely see your Electrophysiologist annually (more often if necessary).   You should receive your ID card for your new device in 4-8 weeks. Keep this card with you at all times once received. Consider wearing a medical alert bracelet or necklace.  Your Pacemaker may be MRI compatible. This will be discussed at your next office visit/wound check.  You should avoid contact with strong electric or magnetic fields.   Do not use amateur (ham) radio equipment or electric (arc) welding torches. MP3 player headphones with magnets should not be used. Some devices are safe to use if held at least 12 inches (30 cm) from your Pacemaker. These include power tools, lawn mowers, and speakers. If you are unsure if something is safe to use, ask your health care provider.  When using your cell phone, hold it to the ear that is on the opposite side from the  Pacemaker. Do not leave your cell phone in a pocket over the Pacemaker.  You may safely use electric blankets, heating pads, computers, and microwave ovens.  Call the office right away if: You have chest pain. You feel more short of breath than you have felt before. You feel more light-headed than you have felt before. Your incision starts to open up.  This information is not intended to replace advice given to you by your health care provider. Make sure you discuss any questions you have with your health care provider.

## 2021-10-20 NOTE — Interval H&P Note (Signed)
History and Physical Interval Note:  10/20/2021 12:31 PM  Jay Ayers  has presented today for surgery, with the diagnosis of chronotropic incompetence.  The various methods of treatment have been discussed with the patient and family. After consideration of risks, benefits and other options for treatment, the patient has consented to  Procedure(s): PACEMAKER IMPLANT (N/A) as a surgical intervention.  The patient's history has been reviewed, patient examined, no change in status, stable for surgery.  I have reviewed the patient's chart and labs.  Questions were answered to the patient's satisfaction.     Cristopher Peru

## 2021-10-20 NOTE — Progress Notes (Signed)
Dr Taylor notified of post CXR-advised to proceed with d/c 

## 2021-10-21 ENCOUNTER — Encounter (HOSPITAL_COMMUNITY): Payer: Self-pay | Admitting: Internal Medicine

## 2021-10-22 ENCOUNTER — Telehealth: Payer: Self-pay

## 2021-10-22 ENCOUNTER — Encounter: Payer: Self-pay | Admitting: Internal Medicine

## 2021-10-22 NOTE — Telephone Encounter (Signed)
Follow-up after same day discharge: Implant date: 10/20/21 MD: Cristopher Peru, MD Device: PPM  Location: L. Chest    Wound check visit: 11/04/2021 90 day MD follow-up: 01/25/22  Remote Transmission received Yes   Confirm OAC restart on: NA

## 2021-10-22 NOTE — Telephone Encounter (Signed)
-----   Message from Shirley Friar, PA-C sent at 10/20/2021  9:41 PM EDT ----- Regarding: Same Day Discharge PPM 10/20/21 Dr. Lovena Le

## 2021-11-01 ENCOUNTER — Encounter: Payer: Self-pay | Admitting: Internal Medicine

## 2021-11-04 ENCOUNTER — Encounter: Payer: PPO | Admitting: Physician Assistant

## 2021-11-08 ENCOUNTER — Telehealth: Payer: Self-pay

## 2021-11-08 NOTE — Telephone Encounter (Signed)
I explained to the patient if he has these episodes to give [place] a call the next day and we will have the nurse review the transmission.

## 2021-11-11 ENCOUNTER — Telehealth: Payer: Self-pay

## 2021-11-11 NOTE — Telephone Encounter (Signed)
Patients home monitor (Biotronik) last updated 11/08/21. Can someone check 11/12/21 to see if it connected.   I have sent patient a message to notify to make sure plugged in/sleeping beside monitor.

## 2021-11-12 NOTE — Telephone Encounter (Signed)
Transmission received. 11/12/2021

## 2021-11-16 ENCOUNTER — Ambulatory Visit: Payer: PPO | Admitting: Interventional Cardiology

## 2021-11-18 ENCOUNTER — Ambulatory Visit: Payer: PPO | Attending: Physician Assistant

## 2021-11-18 DIAGNOSIS — I4589 Other specified conduction disorders: Secondary | ICD-10-CM

## 2021-11-18 LAB — CUP PACEART INCLINIC DEVICE CHECK
Date Time Interrogation Session: 20231116165535
Implantable Lead Connection Status: 753985
Implantable Lead Connection Status: 753985
Implantable Lead Implant Date: 20231018
Implantable Lead Implant Date: 20231018
Implantable Lead Location: 753859
Implantable Lead Location: 753860
Implantable Lead Model: 377
Implantable Lead Model: 377
Implantable Lead Serial Number: 8001068059
Implantable Lead Serial Number: 8001109171
Implantable Pulse Generator Implant Date: 20231018
Pulse Gen Model: 407145
Pulse Gen Serial Number: 1000094833

## 2021-11-18 MED ORDER — METOPROLOL SUCCINATE ER 25 MG PO TB24: 25.0000 mg | ORAL_TABLET | Freq: Every day | ORAL | 3 refills | Status: DC

## 2021-11-18 NOTE — Patient Instructions (Signed)

## 2021-11-18 NOTE — Progress Notes (Signed)
Wound check appointment. Steri-strips removed. Wound without redness or edema. Incision edges approximated, wound well healed. Normal device function. Thresholds, sensing, and impedances consistent with implant measurements. Device programmed at 3.5V/auto capture programmed on for extra safety margin until 3 month visit. Histogram distribution appropriate for patient and level of activity. No mode switches or high ventricular rates noted. Patient educated about wound care, arm mobility, lifting restrictions. ROV in 3 months with implanting physician. 6% PVC  burden.  Per GT-start Toprol XL 25 mg PO daily.

## 2021-11-19 ENCOUNTER — Ambulatory Visit: Payer: PPO | Attending: Cardiovascular Disease

## 2021-11-19 NOTE — Progress Notes (Signed)
Stitch clipped from incision site, patient advised to keep site clean and dry. Wound re-check scheduled for Wednesday 11/24/21.

## 2021-11-23 ENCOUNTER — Telehealth: Payer: Self-pay

## 2021-11-23 NOTE — Telephone Encounter (Signed)
Pt coming in for wound re-check on 11/24/2021 with Device team.    We will address Pt questions regarding his Device then.  Pt seemed to have transmission questions, and described symptoms?   Follow up required.

## 2021-11-24 ENCOUNTER — Ambulatory Visit: Payer: PPO | Attending: Interventional Cardiology

## 2021-11-24 DIAGNOSIS — I495 Sick sinus syndrome: Secondary | ICD-10-CM

## 2021-11-24 NOTE — Patient Instructions (Signed)
   After Your Pacemaker   Monitor your pacemaker site for redness, swelling, and drainage. Call the device clinic at [phone] if you experience these symptoms or fever/chills.  Wash incision site with soap and water daily with clean wash cloth, pat dry with clean towel.

## 2021-11-24 NOTE — Progress Notes (Signed)
Wound re-check for stitch that was clipped at last visit, stitch remains site assessed by Estrella Myrtle PA who was able to pull stitch and cut, no remaining stitch visible, patient educated to wash daily with soap and water and pat dry. Patient stated that he felt like he was having more PVCs, some increased SOB with exercise, patient also has questions about his metoprolol. Advised patient that he would likely need an appointment with provider, patient agreeable to apt with R. Ursuy on 12/13/21

## 2021-11-29 ENCOUNTER — Encounter: Payer: Self-pay | Admitting: Physician Assistant

## 2021-11-29 ENCOUNTER — Ambulatory Visit: Payer: PPO | Attending: Physician Assistant | Admitting: Physician Assistant

## 2021-11-29 VITALS — BP 120/62 | HR 72 | Ht 69.0 in | Wt 155.0 lb

## 2021-11-29 DIAGNOSIS — R0602 Shortness of breath: Secondary | ICD-10-CM

## 2021-11-29 DIAGNOSIS — Z950 Presence of cardiac pacemaker: Secondary | ICD-10-CM | POA: Diagnosis not present

## 2021-11-29 DIAGNOSIS — R079 Chest pain, unspecified: Secondary | ICD-10-CM

## 2021-11-29 DIAGNOSIS — I493 Ventricular premature depolarization: Secondary | ICD-10-CM | POA: Diagnosis not present

## 2021-11-29 DIAGNOSIS — I480 Paroxysmal atrial fibrillation: Secondary | ICD-10-CM

## 2021-11-29 DIAGNOSIS — T82120S Displacement of cardiac electrode, sequela: Secondary | ICD-10-CM

## 2021-11-29 LAB — CUP PACEART INCLINIC DEVICE CHECK
Date Time Interrogation Session: 20231127180012
Implantable Lead Connection Status: 753985
Implantable Lead Connection Status: 753985
Implantable Lead Implant Date: 20231018
Implantable Lead Implant Date: 20231018
Implantable Lead Location: 753859
Implantable Lead Location: 753860
Implantable Lead Model: 377
Implantable Lead Model: 377
Implantable Lead Serial Number: 8001068059
Implantable Lead Serial Number: 8001109171
Implantable Pulse Generator Implant Date: 20231018
Lead Channel Pacing Threshold Amplitude: 0.4 V
Lead Channel Pacing Threshold Amplitude: 0.6 V
Lead Channel Pacing Threshold Pulse Width: 0.4 ms
Lead Channel Pacing Threshold Pulse Width: 0.4 ms
Lead Channel Sensing Intrinsic Amplitude: 12.5 mV
Lead Channel Sensing Intrinsic Amplitude: 3.3 mV
Pulse Gen Model: 407145
Pulse Gen Serial Number: 1000094833

## 2021-11-29 NOTE — Patient Instructions (Signed)
Medication Instructions:   Your physician recommends that you continue on your current medications as directed. Please refer to the Current Medication list given to you today.  *If you need a refill on your cardiac medications before your next appointment, please call your pharmacy*   Lab Work:  [facility] AND [place]   If you have labs (blood work) drawn today and your tests are completely normal, you will receive your results only by: [place] (if you have MyChart) OR A paper copy in the mail If you have any lab test that is abnormal or we need to change your treatment, we will call you to review the results.   Testing/Procedures: Your physician has requested that you have an echocardiogram. Echocardiography is a painless test that uses sound waves to create images of your heart. It provides your doctor with information about the size and shape of your heart and how well your heart's chambers and valves are working. This procedure takes approximately one hour. There are no restrictions for this procedure. Please do NOT wear cologne, perfume, aftershave, or lotions (deodorant is allowed). Please arrive 15 minutes prior to your appointment time.   A chest x-ray takes a picture of the organs and structures inside the chest, including the heart, lungs, and blood vessels. This test can show several things, including, whether the heart is enlarges; whether fluid is building up in the lungs; and whether pacemaker / defibrillator leads are still in place. Located in: [facility] Address: [place], [place], [place] [address] Areas served: [place] and nearby areas Hours:  Tuesday 8AM-5:30PM Wednesday 8AM-5:30PM Thursday 8AM-5:30PM Friday 8AM-5:30PM Saturday Closed Sunday Closed Monday 8AM-5:30PM Phone: [phone]   Follow-Up: At [facility], you and your health needs are our priority.  As part of our continuing mission to provide you with  exceptional heart care, we have created designated Provider Care Teams.  These Care Teams include your primary Cardiologist (physician) and Advanced Practice Providers (APPs -  Physician Assistants and Nurse Practitioners) who all work together to provide you with the care you need, when you need it.  We recommend signing up for the patient portal called "MyChart".  Sign up information is provided on this After Visit Summary.  MyChart is used to connect with patients for Virtual Visits (Telemedicine).  Patients are able to view lab/test results, encounter notes, upcoming appointments, etc.  Non-urgent messages can be sent to your provider as well.   To learn more about what you can do with MyChart, go to [email or web address].    Your next appointment:   2-3  week(s)  The format for your next appointment:   In Person  Provider:   Legrand Como "[name]" Chalmers Cater, PA-C or Tommye Standard, PA-C    Other Instructions   Important Information About [place]

## 2021-11-29 NOTE — Progress Notes (Signed)
Cardiology Office Note Date:  11/29/2021  Patient ID:  Jay, Ayers 10/01/1945, MRN [id number] PCP:  Shon Baton, MD  Cardiologist:  Dr. Tamala Julian Electrophysiologist: Dr. Lovena Le    Chief Complaint:  SOB, PVCs  History of Present Illness: Jay Ayers is a 76 y.o. 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  Post cath, ongoing c/o dyspnea than he feels he should have with activity that he has been able to do his entire life.  He c/o palpitations intermittently, which were relatively new.  He denied orthopnea, PND, swelling, syncope, exertional related chest pain.  Still having episodes of chest pain that are migratory and nonexertion related. Started on ARB back then for possible diastolic HF, planned for monitoring to evaluate for Af (none found)   More recently He saw Dr. Tamala Julian 09/13/21 with an episode of near syncope and some CP.  Near syncope occurred after his usual morning walk,  with a minute or so of weakness, diaphoresis, sat at the kitchen eventually did "slump over" but did not think he fainted.  Planned for EST for his CP, his metoprolol held, suspected event to be vagal.  EST was non-diagnostic with blunted HR only to 69%, max rate 99, pt reported SOB and asked to stop. He had been off the metoprolol a couple days  and referred to Dr. Lovena Le for possible chronotropic incompetence  He saw Dr. Lovena Le 10/07/21, discussed palpitations that seemed associated with PVCs, and poor HR excursion. Planned for PPM for SND, symptomatic bradycardia  Wound check 11/18/21 visit with intact device function, 6% PVC burden and resumed Toprol Had a remaining stitch that was trimmed.  11/24/21 returns for recheck on wound/stitch, [name] was able to cut the stitch with no remaining suture material left/noted. Pt noted that he continued to feel SOB with exertion,  instructed to keep his planned follow up to discuss further. Noting increasing PVC burden  TODAY He has a few concerns Since being started on metoprolol he has observed and INCREASE in PVCs. He does not have symptoms or awareness of them, but periodically checks his carotid pulse and palpates them/the brief pause with them. He remains with exertional intolerance. Reports he tries to walk twice daily, (about a 1/2 mile) in the beginning of his walk he will feel some chest pain, he does not have to stop/does not stop his walking or slow, continuing the walking does not escalate his CP or change it though inclines will make it stronger No rest CP  He had a cath back in 2021, when asked about his CP, he does say that it has been on issue for some time, says the he has had his heart checked "up and down" without a cause, and thought finally the pacemaker was going to make him feel better, have better exertional capacity Still getting winded.  He has long hx of orthostatic dizziness he has not noted any change or escalation in this with the metoprolol.  He has not had any recurrent "Vafal events" like he had a few months ago.    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 hx. Polycythermia Rheumatica   Clotting disorder ([place])    "tends to bleed easily"   GERD (gastroesophageal reflux disease)    Memory disturbance  08/06/2013   evaluated- memory appears to be worsening- no meds helped   Mitral valve prolapse    Neuromuscular disorder ([facility])    PMR (polymyalgia rheumatica) ([facility])    Prostate cancer ([facility])    Weakness of back     Past Surgical History:  Procedure Laterality Date   [place]   COLONOSCOPY WITH PROPOFOL N/A 06/02/2015   Procedure: COLONOSCOPY WITH PROPOFOL;  Surgeon: Garlan Fair, MD;  Location: [facility];  Service: Endoscopy;  Laterality: N/A;   EYE  SURGERY     "cyst on eye excised"   LEFT HEART CATH AND CORONARY ANGIOGRAPHY N/A 01/14/2019   Procedure: LEFT HEART CATH AND CORONARY ANGIOGRAPHY;  Surgeon: Belva Crome, MD;  Location: [place] [facility];  Service: Cardiovascular;  Laterality: N/A;   MINOR NAILBED REPAIR Right 04/25/2013   Procedure: RIGHT THUMB NAIL BIOPSY    (MINOR PROCEDURE);  Surgeon: Cammie Sickle., MD;  Location: [facility];  Service: Orthopedics;  Laterality: Right;  clean class   PACEMAKER IMPLANT N/A 10/20/2021   Procedure: PACEMAKER IMPLANT;  Surgeon: Evans Lance, MD;  Location: [place] [facility];  Service: Cardiovascular;  Laterality: N/A;   PILONIDAL CYST EXCISION     PROSTATE SURGERY  2005   ROTATOR CUFF REPAIR Right 2002   SPINE SURGERY     only epidural injection in back after lumbar surgery several yrs ago   [place]    Current Outpatient Medications  Medication Sig Dispense Refill   acetaminophen (TYLENOL) 500 MG tablet Take 500 mg by mouth daily as needed for headache or moderate pain.     metoprolol succinate (TOPROL XL) 25 MG 24 hr tablet Take 1 tablet (25 mg total) by mouth daily. 90 tablet 3   Multiple Vitamin (MULTIVITAMIN) tablet Take 1 tablet by mouth daily.     omeprazole (PRILOSEC OTC) 20 MG tablet Take 20 mg by mouth daily.     predniSONE (DELTASONE) 1 MG tablet Take 3 mg by mouth daily with breakfast.     No current facility-administered medications for this visit.    Allergies:   Nitrofurantoin   Social History:  The patient  reports that he has never smoked. He has never used smokeless tobacco. He reports current alcohol use. He reports that he does not use drugs.   Family History:  The patient's family history includes Emphysema in his father; Heart Problems in his brother and another family member; Heart failure in his mother; Leukemia in his brother; Multiple sclerosis in his brother  and another family member; Prostate cancer in his brother and another family member; Stroke in his mother.  ROS:  Please see the history of present illness.    All other systems are reviewed and otherwise negative.   PHYSICAL EXAM:  VS:  BP 120/62   Pulse 72   Ht '5\' 9"'$  (1.753 m)   Wt 155 lb (70.3 kg)   SpO2 98%   BMI 22.89 kg/m  BMI: Body mass index is 22.89 kg/m. Well nourished, well developed, in no acute distress HEENT: normocephalic, atraumatic Neck: no JVD, carotid bruits or masses Cardiac:  RRR; no significant murmurs, no rubs, or gallops Lungs:  CTA b/l, no wheezing, rhonchi or rales Abd: soft, nontender MS: no deformity or atrophy Ext: no edema Skin: warm and dry, no rash Neuro:  No gross  deficits appreciated Psych: euthymic mood, full affect  PPM site is stable, no tethering or discomfort, well healed, no signs of stitch or infection   EKG:  Done today and reviewed by myself shows  AP/VS 72bpm, PVCs appear to have 2 morphologies  Device interrogation done today and reviewed by myself:  Battery and lead measurements are good He does have CLS programmed on Acute implant lead outputs remain AP 73% VP 2 PVC burden is 13% since 11/18/21 (overall is 8%) One very short PAT/AFlutter One NSVT HR histograms look ok, 60-100  09/23/21: EST   The stress ECG was not diagnostic due to failure to achieve target HR for adequate stress/85% of predicted and low workload.   A Bruce protocol EXERCISE TOLERANCE TEST was performed. Exercise capacity was moderately impaired. Patient exercised for 3 min and 5 sec. Maximum HR of 99 bpm. MPHR 69.0 %. Peak METS 4.6 . The patient experienced no angina during the test. The test was stopped because the patient experienced dyspnea. The patient requested the test to be stopped. The patient reported dyspnea during the stress test. Suboptimal workload limits sensitivity of this study to detect ischemia. Study was limited by patient's presenting  symptom. Consider stress imaging study or further evaulation.   No ST deviation was noted. There were no arrhythmias during stress. Arrhythmias during recovery: occasional PACs.   Prior study available for comparison from 07/15/2014. Unable to compare due to differences in workload.  Echocardiogram 07/01/2021: IMPRESSIONS   1. Left ventricular ejection fraction, by estimation, is 60 to 65%. The  left ventricle has normal function. The left ventricle has no regional  wall motion abnormalities. Left ventricular diastolic parameters were  normal.   2. Right ventricular systolic function is normal. The right ventricular  size is normal. There is normal pulmonary artery systolic pressure.   3. Left atrial size was mildly dilated.   4. Right atrial size was moderately dilated.   5. The mitral valve is abnormal. Trivial mitral valve regurgitation. No  evidence of mitral stenosis.   6. The aortic valve is tricuspid. There is mild calcification of the  aortic valve. Aortic valve regurgitation is trivial. Aortic valve  sclerosis is present, with no evidence of aortic valve stenosis.   7. The inferior vena cava is normal in size with greater than 50%  respiratory variability, suggesting right atrial pressure of 3 mmHg.     Continuous monitor 03/2019: Study Highlights NSR NSVT with 28 beat episode averaging 161 bpm. SVT episode lasting 29 beats at 116 bpm PVC burden 1.3 % PAC burden < 1%  01/14/2019: LHC Normal left main Widely patent LAD with 2 focal areas of mid LAD systolic compression to less than 25%. Normal circumflex Mild diffuse disease in RCA without obstructive disease. LVEDP 21 mmHg.  EF 55%.  Findings consistent with chronic diastolic dysfunction/heart failure.   RECOMMENDATIONS:   No obstructive disease to account for patient's exertional symptoms.  There is always the possibility of microvascular disease. Diastolic dysfunction documented by the study. Potential therapeutic  considerations would be the addition of low-dose ARB therapy and or beta-blocker therapy.   Recent Labs: 06/03/2021: Magnesium 2.5; TSH 1.560 10/07/2021: BUN 10; Creatinine, Ser 0.91; Hemoglobin 14.5; Platelets 306; Potassium 4.7; Sodium 133  No results found for requested labs within last 365 days.   CrCl cannot be calculated (Patient's most recent lab result is older than the maximum 21 days allowed.).   Wt Readings from Last 3 Encounters:  11/29/21 155 lb (70.3  kg)  10/20/21 150 lb (68 kg)  10/07/21 151 lb (68.5 kg)     Other studies reviewed: Additional studies/records reviewed today include: summarized above  ASSESSMENT AND PLAN:  PPM Intact function Measurements look OK Brief PAT/AFlutter was not labeled an A arrhythmia, AMS detection rate reduced to 160bpm  PVC 13 % burden And rising  Very unclear Will get CXR to check RV lead position, though lead measurements look good  Update his echo Labs/lytes today   SOB/DOE Seems to have a chronic component over the last couple years 4. CP Also seems to be fairly chronic, not escalating, changing, no rest symptoms Cath Jan 2021 with no obstructive disease I will reach out to Dr. Tamala Julian.   5.  Atypical AFlutter 6.  Paroxysmal Afib CHA2DS2Vasc is 2, not currently on a/c Perhaps associated with lyme's disease (treated with doxycycline) Pt self stopped Eliquis In d/w Dr. Lovena Le, pt preferred to not resume [place] and advised he look into watch, kardia mobile, or technology that could monitor his rhythm Follow via his device  ADDEND: 12/01/21: Dr. Lovena Le recommended increasing BB if BP would tolerate > will try Toprol '50mg'$  daily Dr. Tamala Julian did not think he needed an ischemic evaluation.  Disposition: F/u in a couple weeks, if his EF looks OK, may need new ischemic evaluation, I elected not to stop his BB today until his echo is done.  Did not make CLS adjustments, await echo, cxr,  as well.    Current medicines are  reviewed at length with the patient today.  The patient did not have any concerns regarding medicines.  Venetia Night, PA-C 11/29/2021 5:34 PM     [place] [address] [place] [address] [phone] (office)  [phone] (fax)

## 2021-11-30 ENCOUNTER — Ambulatory Visit (HOSPITAL_COMMUNITY): Payer: PPO | Attending: Physician Assistant

## 2021-11-30 ENCOUNTER — Ambulatory Visit
Admission: RE | Admit: 2021-11-30 | Discharge: 2021-11-30 | Disposition: A | Discharge status: Home / Self Care | Payer: PPO | Source: Ambulatory Visit | Attending: Physician Assistant | Admitting: Physician Assistant

## 2021-11-30 DIAGNOSIS — I493 Ventricular premature depolarization: Secondary | ICD-10-CM | POA: Insufficient documentation

## 2021-11-30 DIAGNOSIS — Z950 Presence of cardiac pacemaker: Secondary | ICD-10-CM | POA: Diagnosis not present

## 2021-11-30 DIAGNOSIS — T82120S Displacement of cardiac electrode, sequela: Secondary | ICD-10-CM

## 2021-11-30 DIAGNOSIS — R0602 Shortness of breath: Secondary | ICD-10-CM | POA: Diagnosis not present

## 2021-11-30 LAB — BASIC METABOLIC PANEL
BUN/Creatinine Ratio: 14 (ref 10–24)
BUN: 13 mg/dL (ref 8–27)
CO2: 25 mmol/L (ref 20–29)
Calcium: 9.4 mg/dL (ref 8.6–10.2)
Chloride: 99 mmol/L (ref 96–106)
Creatinine, Ser: 0.93 mg/dL (ref 0.76–1.27)
Glucose: 92 mg/dL (ref 70–99)
Potassium: 5 mmol/L (ref 3.5–5.2)
Sodium: 137 mmol/L (ref 134–144)
eGFR: 85 mL/min/{1.73_m2} (ref >59)

## 2021-11-30 LAB — CBC
Hematocrit: 42.6 % (ref 37.5–51.0)
Hemoglobin: 14.4 g/dL (ref 13.0–17.7)
MCH: 30.3 pg (ref 26.6–33.0)
MCHC: 33.8 g/dL (ref 31.5–35.7)
MCV: 90 fL (ref 79–97)
Platelets: 244 10*3/uL (ref 150–450)
RBC: 4.76 x10E6/uL (ref 4.14–5.80)
RDW: 12.4 % (ref 11.6–15.4)
WBC: 7.7 10*3/uL (ref 3.4–10.8)

## 2021-12-01 ENCOUNTER — Other Ambulatory Visit: Payer: Self-pay | Admitting: *Deleted

## 2021-12-01 DIAGNOSIS — I4589 Other specified conduction disorders: Secondary | ICD-10-CM

## 2021-12-01 LAB — ECHOCARDIOGRAM COMPLETE
Area-P 1/2: 2.92 cm2
P 1/2 time: 375 msec
S' Lateral: 2.9 cm

## 2021-12-01 MED ORDER — METOPROLOL SUCCINATE ER 50 MG PO TB24: 50.0000 mg | ORAL_TABLET | Freq: Every day | ORAL | 2 refills | Status: DC

## 2021-12-13 ENCOUNTER — Encounter: Payer: PPO | Admitting: Physician Assistant

## 2021-12-15 NOTE — Progress Notes (Unsigned)
Cardiology Office Note Date:  12/15/2021  Patient ID:  Jay, Ayers Nov 30, 1945, MRN [id number] PCP:  Jay Baton, MD  Cardiologist:  Dr. Tamala Julian Electrophysiologist: Dr. Lovena Le    Chief Complaint:  SOB, PVCs  History of Present Illness: Jay Ayers is a 76 y.o. 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  Post cath, ongoing c/o dyspnea than he feels he should have with activity that he has been able to do his entire life.  He c/o palpitations intermittently, which were relatively new.  He denied orthopnea, PND, swelling, syncope, exertional related chest pain.  Still having episodes of chest pain that are migratory and nonexertion related. Started on ARB back then for possible diastolic HF, planned for monitoring to evaluate for Af (none found)   More recently He saw Dr. Tamala Julian 09/13/21 with an episode of near syncope and some CP.  Near syncope occurred after his usual morning walk,  with a minute or so of weakness, diaphoresis, sat at the kitchen eventually did "slump over" but did not think he fainted.  Planned for EST for his CP, his metoprolol held, suspected event to be vagal.  EST was non-diagnostic with blunted HR only to 69%, max rate 99, pt reported SOB and asked to stop. He had been off the metoprolol a couple days  and referred to Dr. Lovena Le for possible chronotropic incompetence  He saw Dr. Lovena Le 10/07/21, discussed palpitations that seemed associated with PVCs, and poor HR excursion. Planned for PPM for SND, symptomatic bradycardia  Wound check 11/18/21 visit with intact device function, 6% PVC burden and resumed Toprol Had a remaining stitch that was trimmed.  11/24/21 returns for recheck on wound/stitch, Jay Ayers was able to cut the stitch with no remaining suture material left/noted. Pt noted that he continued to feel SOB with exertion,  instructed to keep his planned follow up to discuss further. Noting increasing PVC burden  I saw him 11/29/21 He has a few concerns Since being started on metoprolol he has observed and INCREASE in PVCs. He does not have symptoms or awareness of them, but periodically checks his carotid pulse and palpates them/the brief pause with them. He remains with exertional intolerance. Reports he tries to walk twice daily, (about a 1/2 mile) in the beginning of his walk he will feel some chest pain, he does not have to stop/does not stop his walking or slow, continuing the walking does not escalate his CP or change it though inclines will make it stronger No rest CP He had a cath back in 2021, when asked about his CP, he does say that it has been on issue for some time, says the he has had his heart checked "up and down" without a cause, and thought finally the pacemaker was going to make him feel better, have better exertional capacity Still getting winded. He has long hx of orthostatic dizziness he has not noted any change or escalation in this with the metoprolol.  He has not had any recurrent "Vafal events" like he had a few months ago. New PVCs Device check noted 13% PVC burden, brief PAT/AFlutter noted but not labeled and his AMS detection rate reduced to 160 Planned for CXR and d/w MD's Dr. Tamala Julian did not think a new ischemic evaluation was needed for PVCs, Dr. Lovena Le recommended increasing his BB if BP would support. His  metoprolol increased to '50mg'$  daily  CXR accounting for pt position difference without clear gross change in lead position  TODAY No significant changes in symptoms since his last visit That being said he is walking a mile every morning, 1/2 mile every evening and going to the gym 3days/week does well with these activities. Still gets the same CP/SOB with inclines  Has an awareness of the PVCs.  C/w some orthostatic dizziness, not any worse with the increased metoprolol dose,  and also chronic.  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 hx. Polycythermia Rheumatica   Clotting disorder ([place])    "tends to bleed easily"   GERD (gastroesophageal reflux disease)    Memory disturbance 08/06/2013   evaluated- memory appears to be worsening- no meds helped   Mitral valve prolapse    Neuromuscular disorder (HCC)    PMR (polymyalgia rheumatica) (HCC)    Prostate cancer (HCC)    Weakness of back     Past Surgical History:  Procedure Laterality Date   [place]   COLONOSCOPY WITH PROPOFOL N/A 06/02/2015   Procedure: COLONOSCOPY WITH PROPOFOL;  Surgeon: Garlan Fair, MD;  Location: [facility];  Service: Endoscopy;  Laterality: N/A;   EYE SURGERY     "cyst on eye excised"   LEFT HEART CATH AND CORONARY ANGIOGRAPHY N/A 01/14/2019   Procedure: LEFT HEART CATH AND CORONARY ANGIOGRAPHY;  Surgeon: Belva Crome, MD;  Location: [facility];  Service: Cardiovascular;  Laterality: N/A;   MINOR NAILBED REPAIR Right 04/25/2013   Procedure: RIGHT THUMB NAIL BIOPSY    (MINOR PROCEDURE);  Surgeon: Cammie Sickle., MD;  Location: [facility];  Service: Orthopedics;  Laterality: Right;  clean class   PACEMAKER IMPLANT N/A 10/20/2021   Procedure: PACEMAKER IMPLANT;  Surgeon: Evans Lance, MD;  Location: [facility];  Service: Cardiovascular;  Laterality: N/A;   PILONIDAL CYST EXCISION     PROSTATE SURGERY  2005   ROTATOR CUFF REPAIR Right 2002   SPINE SURGERY     only epidural injection in back after lumbar surgery several yrs ago   [place]    Current Outpatient Medications  Medication Sig Dispense Refill    acetaminophen (TYLENOL) 500 MG tablet Take 500 mg by mouth daily as needed for headache or moderate pain.     metoprolol succinate (TOPROL XL) 50 MG 24 hr tablet Take 1 tablet (50 mg total) by mouth daily. 90 tablet 2   Multiple Vitamin (MULTIVITAMIN) tablet Take 1 tablet by mouth daily.     omeprazole (PRILOSEC OTC) 20 MG tablet Take 20 mg by mouth daily.     predniSONE (DELTASONE) 1 MG tablet Take 3 mg by mouth daily with breakfast.     No current facility-administered medications for this visit.    Allergies:   Nitrofurantoin   Social History:  The patient  reports that he has never smoked. He has never used smokeless tobacco. He reports current alcohol use. He reports that he does not use drugs.   Family History:  The patient's family history includes Emphysema in his father;  Heart Problems in his brother and another family member; Heart failure in his mother; Leukemia in his brother; Multiple sclerosis in his brother and another family member; Prostate cancer in his brother and another family member; Stroke in his mother.  ROS:  Please see the history of present illness.    All other systems are reviewed and otherwise negative.   PHYSICAL EXAM:  VS:  There were no vitals taken for this visit. BMI: There is no height or weight on file to calculate BMI. Well nourished, well developed, in no acute distress HEENT: normocephalic, atraumatic Neck: no JVD, carotid bruits or masses Cardiac:  RRR; extrasystoles noted, no significant murmurs, no rubs, or gallops Lungs: CTA b/l, no wheezing, rhonchi or rales Abd: soft, nontender MS: no deformity or atrophy Ext:no edema Skin: warm and dry, no rash Neuro:  No gross deficits appreciated Psych: euthymic mood, full affect   PPM site is stable, no tethering or discomfort, well healed  EKG:  not done today  Device interrogation done today and reviewed by myself:  Battery and lead measurements are good PVC burden at 12% No  arrhythmias  09/23/21: EST   The stress ECG was not diagnostic due to failure to achieve target HR for adequate stress/85% of predicted and low workload.   A Bruce protocol EXERCISE TOLERANCE TEST was performed. Exercise capacity was moderately impaired. Patient exercised for 3 min and 5 sec. Maximum HR of 99 bpm. MPHR 69.0 %. Peak METS 4.6 . The patient experienced no angina during the test. The test was stopped because the patient experienced dyspnea. The patient requested the test to be stopped. The patient reported dyspnea during the stress test. Suboptimal workload limits sensitivity of this study to detect ischemia. Study was limited by patient's presenting symptom. Consider stress imaging study or further evaulation.   No ST deviation was noted. There were no arrhythmias during stress. Arrhythmias during recovery: occasional PACs.   Prior study available for comparison from 07/15/2014. Unable to compare due to differences in workload.  Echocardiogram 07/01/2021: IMPRESSIONS   1. Left ventricular ejection fraction, by estimation, is 60 to 65%. The  left ventricle has normal function. The left ventricle has no regional  wall motion abnormalities. Left ventricular diastolic parameters were  normal.   2. Right ventricular systolic function is normal. The right ventricular  size is normal. There is normal pulmonary artery systolic pressure.   3. Left atrial size was mildly dilated.   4. Right atrial size was moderately dilated.   5. The mitral valve is abnormal. Trivial mitral valve regurgitation. No  evidence of mitral stenosis.   6. The aortic valve is tricuspid. There is mild calcification of the  aortic valve. Aortic valve regurgitation is trivial. Aortic valve  sclerosis is present, with no evidence of aortic valve stenosis.   7. The inferior vena cava is normal in size with greater than 50%  respiratory variability, suggesting right atrial pressure of 3 mmHg.     Continuous monitor  03/2019: Study Highlights NSR NSVT with 28 beat episode averaging 161 bpm. SVT episode lasting 29 beats at 116 bpm PVC burden 1.3 % PAC burden < 1%  01/14/2019: LHC Normal left main Widely patent LAD with 2 focal areas of mid LAD systolic compression to less than 25%. Normal circumflex Mild diffuse disease in RCA without obstructive disease. LVEDP 21 mmHg.  EF 55%.  Findings consistent with chronic diastolic dysfunction/heart failure.   RECOMMENDATIONS:   No obstructive disease to account for patient's  exertional symptoms.  There is always the possibility of microvascular disease. Diastolic dysfunction documented by the study. Potential therapeutic considerations would be the addition of low-dose ARB therapy and or beta-blocker therapy.   Recent Labs: 06/03/2021: Magnesium 2.5; TSH 1.560 11/29/2021: BUN 13; Creatinine, Ser 0.93; Hemoglobin 14.4; Platelets 244; Potassium 5.0; Sodium 137  No results found for requested labs within last 365 days.   CrCl cannot be calculated (Unknown ideal weight.).   Wt Readings from Last 3 Encounters:  11/29/21 155 lb (70.3 kg)  10/20/21 150 lb (68 kg)  10/07/21 151 lb (68.5 kg)     Other studies reviewed: Additional studies/records reviewed today include: summarized above  ASSESSMENT AND PLAN:  PPM Intact function Measurements look OK   PVC 12 % burden Tolerating current dose of metoprolol, I don't think her will be able to tolerate more with his orthostatic dizziness  I will reach out to Dr. Lovena Le, perhaps an AAD vs monitoring cinically.   SOB/DOE Seems to have a chronic component over the last couple years 4. CP Also seems to be fairly chronic, not escalating, changing, no rest symptoms Cath Jan 2021 with no obstructive disease  Revisited with Dr. Tamala Julian again today CLS is on no need for new ischemic evaluation  The patient recalls by a prior CXR mentioning that he may have COPD, we discussed that discussing with his PMD  perhaps PFT's Pulm eval may be beneficial   5.  Atypical AFlutter 6.  Paroxysmal Afib CHA2DS2Vasc is 2, not currently on a/c Perhaps associated with lyme's disease (treated with doxycycline) Pt self stopped Eliquis In d/w Dr. Lovena Le, pt preferred to not resume Jo[place]  No AMS episodes noted   Disposition: He has his 3 mo in place with Dr. Lovena Le, we can see him sooner if needed.   Current medicines are reviewed at length with the patient today.  The patient did not have any concerns regarding medicines.  Venetia Night, PA-C 12/15/2021 7:02 AM     [facility] [place] [id number] [phone] (office)  [phone] (fax)

## 2021-12-16 ENCOUNTER — Encounter: Payer: Self-pay | Admitting: Physician Assistant

## 2021-12-16 ENCOUNTER — Ambulatory Visit: Payer: PPO | Attending: Physician Assistant | Admitting: Physician Assistant

## 2021-12-16 VITALS — BP 108/58 | HR 56 | Ht 69.0 in | Wt 154.0 lb

## 2021-12-16 DIAGNOSIS — I484 Atypical atrial flutter: Secondary | ICD-10-CM | POA: Diagnosis not present

## 2021-12-16 DIAGNOSIS — Z950 Presence of cardiac pacemaker: Secondary | ICD-10-CM | POA: Diagnosis not present

## 2021-12-16 DIAGNOSIS — I480 Paroxysmal atrial fibrillation: Secondary | ICD-10-CM | POA: Diagnosis not present

## 2021-12-16 DIAGNOSIS — R079 Chest pain, unspecified: Secondary | ICD-10-CM | POA: Diagnosis not present

## 2021-12-16 LAB — CUP PACEART INCLINIC DEVICE CHECK
Date Time Interrogation Session: 20231214172921
Implantable Lead Connection Status: 753985
Implantable Lead Connection Status: 753985
Implantable Lead Implant Date: 20231018
Implantable Lead Implant Date: 20231018
Implantable Lead Location: 753859
Implantable Lead Location: 753860
Implantable Lead Model: 377
Implantable Lead Model: 377
Implantable Lead Serial Number: 8001068059
Implantable Lead Serial Number: 8001109171
Implantable Pulse Generator Implant Date: 20231018
Lead Channel Pacing Threshold Amplitude: 0.4 V
Lead Channel Pacing Threshold Amplitude: 0.6 V
Lead Channel Pacing Threshold Pulse Width: 0.4 ms
Lead Channel Pacing Threshold Pulse Width: 0.4 ms
Lead Channel Sensing Intrinsic Amplitude: 11.3 mV
Lead Channel Sensing Intrinsic Amplitude: 3.3 mV
Pulse Gen Model: 407145
Pulse Gen Serial Number: 1000094833

## 2021-12-16 NOTE — Patient Instructions (Addendum)
Medication Instructions:   Your physician recommends that you continue on your current medications as directed. Please refer to the Current Medication list given to you today.   *If you need a refill on your cardiac medications before your next appointment, please call your pharmacy*   Lab Work: NONE ORDERED  TODAY   If you have labs (blood work) drawn today and your tests are completely normal, you will receive your results only by: MyChart Message (if you have MyChart) OR A paper copy in the mail If you have any lab test that is abnormal or we need to change your treatment, we will call you to review the results.   Testing/Procedures: NONE ORDERED  TODAY     Follow-Up: At [facility], you and your health needs are our priority.  As part of our continuing mission to provide you with exceptional heart care, we have created designated Provider Care Teams.  These Care Teams include your primary Cardiologist (physician) and Advanced Practice Providers (APPs -  Physician Assistants and Nurse Practitioners) who all work together to provide you with the care you need, when you need it.  We recommend signing up for the patient portal called "MyChart".  Sign up information is provided on this After Visit Summary.  MyChart is used to connect with patients for Virtual Visits (Telemedicine).  Patients are able to view lab/test results, encounter notes, upcoming appointments, etc.  Non-urgent messages can be sent to your provider as well.   To learn more about what you can do with MyChart, go to [email or web address].    Your next appointment:  AS SCHEDULED   The format for your next appointment:   In Person  Provider:   Gregg Taylor, MD    Other Instructions   Important Information About Sugar       

## 2022-01-04 DIAGNOSIS — H02831 Dermatochalasis of right upper eyelid: Secondary | ICD-10-CM | POA: Diagnosis not present

## 2022-01-04 DIAGNOSIS — H2513 Age-related nuclear cataract, bilateral: Secondary | ICD-10-CM | POA: Diagnosis not present

## 2022-01-04 DIAGNOSIS — H43813 Vitreous degeneration, bilateral: Secondary | ICD-10-CM | POA: Diagnosis not present

## 2022-01-04 DIAGNOSIS — H02834 Dermatochalasis of left upper eyelid: Secondary | ICD-10-CM | POA: Diagnosis not present

## 2022-01-04 DIAGNOSIS — H43393 Other vitreous opacities, bilateral: Secondary | ICD-10-CM | POA: Diagnosis not present

## 2022-01-04 DIAGNOSIS — H04123 Dry eye syndrome of bilateral lacrimal glands: Secondary | ICD-10-CM | POA: Diagnosis not present

## 2022-01-21 ENCOUNTER — Ambulatory Visit: Payer: PPO | Attending: Internal Medicine

## 2022-01-21 DIAGNOSIS — I4892 Unspecified atrial flutter: Secondary | ICD-10-CM

## 2022-01-21 LAB — CUP PACEART REMOTE DEVICE CHECK
Battery Voltage: 100 V
Date Time Interrogation Session: 20240119095359
Implantable Lead Connection Status: 753985
Implantable Lead Connection Status: 753985
Implantable Lead Implant Date: 20231018
Implantable Lead Implant Date: 20231018
Implantable Lead Location: 753859
Implantable Lead Location: 753860
Implantable Lead Model: 377
Implantable Lead Model: 377
Implantable Lead Serial Number: 8001068059
Implantable Lead Serial Number: 8001109171
Implantable Pulse Generator Implant Date: 20231018
Pulse Gen Model: 407145
Pulse Gen Serial Number: 1000094833

## 2022-01-25 ENCOUNTER — Ambulatory Visit: Payer: PPO | Attending: Internal Medicine | Admitting: Internal Medicine

## 2022-01-25 ENCOUNTER — Encounter: Payer: Self-pay | Admitting: Internal Medicine

## 2022-01-25 VITALS — BP 112/66 | HR 66 | Ht 69.0 in | Wt 156.0 lb

## 2022-01-25 DIAGNOSIS — R002 Palpitations: Secondary | ICD-10-CM

## 2022-01-25 DIAGNOSIS — I4892 Unspecified atrial flutter: Secondary | ICD-10-CM

## 2022-01-25 DIAGNOSIS — Z950 Presence of cardiac pacemaker: Secondary | ICD-10-CM

## 2022-01-25 MED ORDER — FLECAINIDE ACETATE 50 MG PO TABS: 50.0000 mg | ORAL_TABLET | Freq: Two times a day (BID) | ORAL | 3 refills | Status: DC

## 2022-01-25 NOTE — Patient Instructions (Addendum)
Medication Instructions:  Your physician has recommended you make the following change in your medication: Flecainide 50 mg Flecainide: You will:  Take 1 tablet (50 mg total) by mouth 2 (two) times daily.   Lab Work: None ordered.  If you have labs (blood work) drawn today and your tests are completely normal, you will receive your results only by: [place] (if you have MyChart) OR A paper copy in the mail If you have any lab test that is abnormal or we need to change your treatment, we will call you to review the results.  Testing/Procedures: None ordered.  Follow-Up: At [facility], you and your health needs are our priority.  As part of our continuing mission to provide you with exceptional heart care, we have created designated Provider Care Teams.  These Care Teams include your primary Cardiologist (physician) and Advanced Practice Providers (APPs -  Physician Assistants and Nurse Practitioners) who all work together to provide you with the care you need, when you need it.  We recommend signing up for the patient portal called "MyChart".  Sign up information is provided on this After Visit Summary.  MyChart is used to connect with patients for Virtual Visits (Telemedicine).  Patients are able to view lab/test results, encounter notes, upcoming appointments, etc.  Non-urgent messages can be sent to your provider as well.   To learn more about what you can do with MyChart, go to [email or web address].    Your next appointment:   Please schedule a 2 week RN / EKG visit for new Flecainide start per Dr. Cristopher Peru.    The format for your next appointment:   In Person  Provider:   Cristopher Peru, MD{or one of the following Advanced Practice Providers on your designated Care Team:   Tommye Standard, [place] Legrand Como "[name]" Chalmers Cater, [place]  Remote monitoring is used to monitor your Pacemaker from home. This monitoring reduces the number of office visits required to check your device  to one time per year. It allows [place] to keep an eye on the functioning of your device to ensure it is working properly. You are scheduled for a device check from home on 04/22/2022. You may send your transmission at any time that day. If you have a wireless device, the transmission will be sent automatically. After your physician reviews your transmission, you will receive a postcard with your next transmission date.  Flecainide Tablets What is this medication? FLECAINIDE (FLEK a nide) prevents and treats a fast or irregular heartbeat (arrhythmia). It is often used to treat a type of arrhythmia known as AFib (atrial fibrillation). It works by slowing down overactive electric signals in the heart, which stabilizes your heart rhythm. It belongs to a group of medications called antiarrhythmics. This medicine may be used for other purposes; ask your health care provider or pharmacist if you have questions. COMMON BRAND NAME(S): Tambocor What should I tell my care team before I take this medication? They need to know if you have any of these conditions: High or low levels of potassium in the blood Heart disease including heart rhythm and heart rate problems Kidney disease Liver disease Recent heart attack An unusual or allergic reaction to flecainide, other medications, foods, dyes, or preservatives Pregnant or trying to get pregnant Breastfeeding How should I use this medication? Take this medication by mouth with a glass of water. Take it as directed on the prescription label at the same time every day. You can take it with or without food.  If it upsets your stomach, take it with food. Do not take your medication more often than directed. Do not stop taking this medication suddenly. This may cause serious, heart-related side effects. If your care team wants you to stop the medication, the dose may be slowly lowered over time to avoid any side effects. Talk to your care team about the use of this  medication in children. While it may be prescribed for children as Viha Kriegel as 1 year for selected conditions, precautions do apply. Overdosage: If you think you have taken too much of this medicine contact a poison control center or emergency room at once. NOTE: This medicine is only for you. Do not share this medicine with others. What if I miss a dose? If you miss a dose, take it as soon as you can. If it is almost time for your next dose, take only that dose. Do not take double or extra doses. What may interact with this medication? Do not take this medication with any of the following: Amoxapine Arsenic trioxide Certain antibiotics, such as clarithromycin, erythromycin, gatifloxacin, gemifloxacin, levofloxacin, moxifloxacin, sparfloxacin, or troleandomycin Certain antidepressants, called tricyclic antidepressants such as amitriptyline, imipramine, or nortriptyline Certain medications for irregular heartbeat, such as disopyramide, encainide, moricizine, procainamide, propafenone, and quinidine Cisapride Delavirdine Droperidol Haloperidol [place] Imatinib Levomethadyl Maprotiline Medications for malaria, such as chloroquine and halofantrine Pentamidine Phenothiazines, such as chlorpromazine, mesoridazine, prochlorperazine, thioridazine Pimozide Quinine Ranolazine Ritonavir Sertindole This medication may also interact with the following: Cimetidine Dofetilide Medications for angina or blood pressure Medications for irregular heartbeat, such as amiodarone and digoxin Ziprasidone This list may not describe all possible interactions. Give your health care provider a list of all the medicines, herbs, non-prescription drugs, or dietary supplements you use. Also tell them if you smoke, drink alcohol, or use illegal drugs. Some items may interact with your medicine. What should I watch for while using this medication? Visit your care team for regular checks on your progress. Because your  condition and the use of this medication carries some risk, it is a good idea to carry an identification card, necklace, or bracelet with details of your condition, medications, and care team. Check your blood pressure and pulse rate as directed. Know what your blood pressure and pulse rate should be and when tod contact your care team. Your care team may schedule regular blood tests and electrocardiograms to check your progress. This medication may affect your coordination, reaction time, or judgment. Do not drive or operate machinery until you know how this medication affects you. Sit up or stand slowly to reduce the risk of dizzy or fainting spells. Drinking alcohol with this medication can increase the risk of these side effects. What side effects may I notice from receiving this medication? Side effects that you should report to your care team as soon as possible: Allergic reactions--skin rash, itching, hives, swelling of the face, lips, tongue, or throat Heart failure--shortness of breath, swelling of the ankles, feet, or hands, sudden weight gain, unusual weakness or fatigue Heart rhythm changes--fast or irregular heartbeat, dizziness, feeling faint or lightheaded, chest pain, trouble breathing Liver injury--right upper belly pain, loss of appetite, nausea, light-colored stool, dark yellow or brown urine, yellowing skin or eyes, unusual weakness or fatigue Side effects that usually do not require medical attention (report to your care team if they continue or are bothersome): Blurry vision Constipation Dizziness Fatigue Headache Nausea Tremors or shaking This list may not describe all possible side effects. Call your doctor  for medical advice about side effects. You may report side effects to FDA at 1-800-FDA-1088. Where should I keep my medication? Keep out of the reach of children and pets. Store at room temperature between 15 and 30 degrees C (59 and 86 degrees F). Protect from light.  Keep container tightly closed. Throw away any unused medication after the expiration date. NOTE: This sheet is a summary. It may not cover all possible information. If you have questions about this medicine, talk to your doctor, pharmacist, or health care provider.  2023 Elsevier/Gold Standard (2004-03-05 00:00:00)

## 2022-01-25 NOTE — Progress Notes (Signed)
HPI Mr. Fanfan returns today for PPM followup. He is a pleasant 77 yo man with a h/o chronotropic incompetence and afib/flutter and PVC's who underwent PPM insertion several months ago. In the interim, he notes occaisional hard heart beats. No syncope. He has some dyspnea. At times his HR after exercise is in the 60's and at other times in the 90's. He has a h/o heart cath which showed no obstructive CAD. His exercise test prior to PPM demonstrated marked chronotropic incompetence. Allergies  Allergen Reactions   Nitrofurantoin Rash     Current Outpatient Medications  Medication Sig Dispense Refill   acetaminophen (TYLENOL) 500 MG tablet Take 500 mg by mouth daily as needed for headache or moderate pain.     metoprolol succinate (TOPROL XL) 50 MG 24 hr tablet Take 1 tablet (50 mg total) by mouth daily. 90 tablet 2   Multiple Vitamin (MULTIVITAMIN) tablet Take 1 tablet by mouth daily.     omeprazole (PRILOSEC OTC) 20 MG tablet Take 20 mg by mouth daily.     predniSONE (DELTASONE) 1 MG tablet Take 1 mg by mouth daily with breakfast.     No current facility-administered medications for this visit.     Past Medical History:  Diagnosis Date   Arthritis    knees, back , shoulders. hx. Polycythermia Rheumatica   Clotting disorder ([place])    "tends to bleed easily"   GERD (gastroesophageal reflux disease)    Memory disturbance 08/06/2013   evaluated- memory appears to be worsening- no meds helped   Mitral valve prolapse    Neuromuscular disorder (HCC)    PMR (polymyalgia rheumatica) (HCC)    Prostate cancer (HCC)    Weakness of back     ROS:   All systems reviewed and negative except as noted in the HPI.   Past Surgical History:  Procedure Laterality Date   [place]   COLONOSCOPY WITH PROPOFOL N/A 06/02/2015   Procedure: COLONOSCOPY WITH PROPOFOL;  Surgeon: Garlan Fair, MD;  Location: [facility];  Service: Endoscopy;  Laterality: N/A;    EYE SURGERY     "cyst on eye excised"   LEFT HEART CATH AND CORONARY ANGIOGRAPHY N/A 01/14/2019   Procedure: LEFT HEART CATH AND CORONARY ANGIOGRAPHY;  Surgeon: Belva Crome, MD;  Location: [place] [facility];  Service: Cardiovascular;  Laterality: N/A;   MINOR NAILBED REPAIR Right 04/25/2013   Procedure: RIGHT THUMB NAIL BIOPSY    (MINOR PROCEDURE);  Surgeon: Cammie Sickle., MD;  Location: [facility];  Service: Orthopedics;  Laterality: Right;  clean class   PACEMAKER IMPLANT N/A 10/20/2021   Procedure: PACEMAKER IMPLANT;  Surgeon: Evans Lance, MD;  Location: [place] [facility];  Service: Cardiovascular;  Laterality: N/A;   PILONIDAL CYST EXCISION     PROSTATE SURGERY  2005   ROTATOR CUFF REPAIR Right 2002   SPINE SURGERY     only epidural injection in back after lumbar surgery several yrs ago   [place]     Family History  Problem Relation Age of Onset   Stroke Mother    Heart failure Mother    Emphysema Father    Multiple sclerosis Brother    Heart Problems Brother    Prostate cancer Brother    Leukemia Brother    Multiple sclerosis Other  Prostate cancer Other    Heart Problems Other      Social History   Socioeconomic History   Marital status: Married    Spouse name: Not on file   Number of children: 5   Years of education: BS   Highest education level: Not on file  Occupational History   Occupation: Retired  Tobacco Use   Smoking status: Never   Smokeless tobacco: Never  Substance and Sexual Activity   Alcohol use: Yes    Comment: occ   Drug use: No   Sexual activity: Not on file  Other Topics Concern   Not on file  Social History Narrative   Lives   Patient is right handed.   Patient drinks 3 cups caffeine daily.   Social Determinants of Health   Financial Resource Strain: Not on file  Food Insecurity: Not on file  Transportation Needs: Not on  file  Physical Activity: Not on file  Stress: Not on file  Social Connections: Not on file  Intimate Partner Violence: Not on file     BP 112/66   Pulse 66   Ht '5\' 9"'$  (1.753 m)   Wt 156 lb (70.8 kg)   SpO2 99%   BMI 23.04 kg/m   Physical Exam:  Well appearing NAD HEENT: Unremarkable Neck:  No JVD, no thyromegally Lymphatics:  No adenopathy Back:  No CVA tenderness Lungs:  Clear with no wheezes HEART:  Regular rate rhythm, no murmurs, no rubs, no clicks Abd:  soft, positive bowel sounds, no organomegally, no rebound, no guarding Ext:  2 plus pulses, no edema, no cyanosis, no clubbing Skin:  No rashes no nodules Neuro:  CN II through XII intact, motor grossly intact  EKG - nsr with atrial pacing and PVC's  DEVICE  Normal device function.  See PaceArt for details.   Assess/Plan: Palpitations/chest pressure - I suspect that the PVC's are symptomatic. He has had about 11% on his PM interrogation. I have recommended initiation of low dose flecainide. He will return in 2 weeks for an ECG. Additional followup will depend on how he responds. PVC's - hopefully these will improve with flecainide PAF - he is mostly maintaining NSR.  Coags - patient is not on systemic anti-coag despite the risk of stroke. He has preferred to avoid.   Carleene Overlie Geovany Trudo,MD

## 2022-02-04 ENCOUNTER — Ambulatory Visit: Payer: PPO | Admitting: Internal Medicine

## 2022-02-07 NOTE — Progress Notes (Signed)
Remote pacemaker transmission.   

## 2022-02-08 ENCOUNTER — Ambulatory Visit: Payer: PPO | Attending: Cardiovascular Disease | Admitting: *Deleted

## 2022-02-08 VITALS — HR 79 | Wt 158.8 lb

## 2022-02-08 DIAGNOSIS — I493 Ventricular premature depolarization: Secondary | ICD-10-CM

## 2022-02-08 NOTE — Patient Instructions (Signed)
Medication Instructions:  Your physician recommends that you continue on your current medications as directed. Please refer to the Current Medication list given to you today.  *If you need a refill on your cardiac medications before your next appointment, please call your pharmacy*   Lab Work: None ordered   Testing/Procedures: None ordered   Follow-Up: At [facility], you and your health needs are our priority.  As part of our continuing mission to provide you with exceptional heart care, we have created designated Provider Care Teams.  These Care Teams include your primary Cardiologist (physician) and Advanced Practice Providers (APPs -  Physician Assistants and Nurse Practitioners) who all work together to provide you with the care you need, when you need it.   Your next appointment:   To be   determined  (will send a message to Dr. Lovena Le and his nurse on when you should follow  up)  The format for your next appointment:   In Person  Provider:   You may see Dr Lovena Le or one of the following Advanced Practice Providers on your designated Care Team:   Tommye Standard, [place] Legrand Como "[name]" Chalmers Cater, PA-C :1}    Thank you for choosing [facility]!!

## 2022-02-08 NOTE — Progress Notes (Signed)
   Nurse Visit   Date of Encounter: 02/08/2022 ID: ANIRUDDH CIAVARELLA, DOB May 14, 1945, MRN [id number]  PCP:  Shon Baton, [place] Providers Cardiologist:  Crissie Sickles, MD   Visit Details   VS:  Pulse 79   Wt 158 lb 12.8 oz (72 kg)   BMI 23.45 kg/m  , BMI Body mass index is 23.45 kg/m.  Wt Readings from Last 3 Encounters:  02/08/22 158 lb 12.8 oz (72 kg)  01/25/22 156 lb (70.8 kg)  12/16/21 154 lb (69.9 kg)     Reason for visit: EKG post Flecainide start Performed today: Vitals, EKG, Provider consulted: , and Education Changes (medications, testing, etc.) : none Length of Visit: 20 minutes  EKG showing normal rhythm, paced, no PVCs. PR 162 ms QRS 88 ms  Reviewed with Dr. Lovena Le, no changes.  Patient advised to continue monitoring PVCs and let us know if he feels increased frequency.    Medications Adjustments/Labs and Tests Ordered: No orders of the defined types were placed in this encounter.  No orders of the defined types were placed in this encounter.    Signed, Stanton Kidney, RN  02/08/2022 3:58 PM

## 2022-02-10 ENCOUNTER — Encounter (HOSPITAL_COMMUNITY): Payer: Self-pay | Admitting: *Deleted

## 2022-03-17 ENCOUNTER — Telehealth: Payer: Self-pay | Admitting: Internal Medicine

## 2022-03-17 NOTE — Telephone Encounter (Signed)
Pt c/o BP issue: STAT if pt c/o blurred vision, one-sided weakness or slurred speech  1. What are your last 5 BP readings? 138/78 last night, 140/70 this morning when checked.   2. Are you having any other symptoms (ex. Dizziness, headache, blurred vision, passed out)? Not at the moment because he's sitting down, just a little lethargic and out of breathe sometimes.   3. What is your BP issue? Pt is concerned because this is not usual for him.

## 2022-03-17 NOTE — Telephone Encounter (Addendum)
Pt stated two days ago while walking he started was experiencing shortness of breath and lightheadedness. As of last night, he was feeling tired and little short of breath during that time his bp was 138/78 and HR 105. This morning his bp is 143/73 HR 75, experiencing couple of PVC's. Spoke to Dr. Lovena Le and advised to the patient to give the medication some extra time to work. Explained ED precautions. Patient voiced understanding. Will forward to MD and nurse.

## 2022-04-04 DIAGNOSIS — R8281 Pyuria: Secondary | ICD-10-CM | POA: Diagnosis not present

## 2022-04-04 DIAGNOSIS — D72819 Decreased white blood cell count, unspecified: Secondary | ICD-10-CM | POA: Diagnosis not present

## 2022-04-22 ENCOUNTER — Ambulatory Visit (INDEPENDENT_AMBULATORY_CARE_PROVIDER_SITE_OTHER): Payer: PPO

## 2022-04-22 DIAGNOSIS — Z1339 Encounter for screening examination for other mental health and behavioral disorders: Secondary | ICD-10-CM | POA: Diagnosis not present

## 2022-04-22 DIAGNOSIS — I341 Nonrheumatic mitral (valve) prolapse: Secondary | ICD-10-CM | POA: Diagnosis not present

## 2022-04-22 DIAGNOSIS — R82998 Other abnormal findings in urine: Secondary | ICD-10-CM | POA: Diagnosis not present

## 2022-04-22 DIAGNOSIS — N401 Enlarged prostate with lower urinary tract symptoms: Secondary | ICD-10-CM | POA: Diagnosis not present

## 2022-04-22 DIAGNOSIS — G3184 Mild cognitive impairment, so stated: Secondary | ICD-10-CM | POA: Diagnosis not present

## 2022-04-22 DIAGNOSIS — I4892 Unspecified atrial flutter: Secondary | ICD-10-CM | POA: Diagnosis not present

## 2022-04-22 DIAGNOSIS — Z0000 Encounter for general adult medical examination without abnormal findings: Secondary | ICD-10-CM | POA: Diagnosis not present

## 2022-04-22 DIAGNOSIS — I493 Ventricular premature depolarization: Secondary | ICD-10-CM | POA: Diagnosis not present

## 2022-04-22 DIAGNOSIS — I5189 Other ill-defined heart diseases: Secondary | ICD-10-CM | POA: Diagnosis not present

## 2022-04-22 DIAGNOSIS — Z1331 Encounter for screening for depression: Secondary | ICD-10-CM | POA: Diagnosis not present

## 2022-04-22 DIAGNOSIS — I484 Atypical atrial flutter: Secondary | ICD-10-CM | POA: Diagnosis not present

## 2022-04-22 DIAGNOSIS — D692 Other nonthrombocytopenic purpura: Secondary | ICD-10-CM | POA: Diagnosis not present

## 2022-04-22 DIAGNOSIS — D72819 Decreased white blood cell count, unspecified: Secondary | ICD-10-CM | POA: Diagnosis not present

## 2022-04-22 DIAGNOSIS — C61 Malignant neoplasm of prostate: Secondary | ICD-10-CM | POA: Diagnosis not present

## 2022-04-22 LAB — CUP PACEART REMOTE DEVICE CHECK
Battery Voltage: 95
Date Time Interrogation Session: 20240419071127
Implantable Lead Connection Status: 753985
Implantable Lead Connection Status: 753985
Implantable Lead Implant Date: 20231018
Implantable Lead Implant Date: 20231018
Implantable Lead Location: 753859
Implantable Lead Location: 753860
Implantable Lead Model: 377
Implantable Lead Model: 377
Implantable Lead Serial Number: 8001068059
Implantable Lead Serial Number: 8001109171
Implantable Pulse Generator Implant Date: 20231018
Pulse Gen Model: 407145
Pulse Gen Serial Number: 1000094833

## 2022-05-20 NOTE — Progress Notes (Signed)
Remote pacemaker transmission.   

## 2022-07-04 ENCOUNTER — Encounter: Payer: Self-pay | Admitting: Internal Medicine

## 2022-07-22 ENCOUNTER — Ambulatory Visit (INDEPENDENT_AMBULATORY_CARE_PROVIDER_SITE_OTHER): Payer: PPO

## 2022-07-22 DIAGNOSIS — I4892 Unspecified atrial flutter: Secondary | ICD-10-CM

## 2022-07-26 LAB — CUP PACEART REMOTE DEVICE CHECK
Battery Voltage: 95
Date Time Interrogation Session: 20240722093100
Implantable Lead Connection Status: 753985
Implantable Lead Connection Status: 753985
Implantable Lead Implant Date: 20231018
Implantable Lead Implant Date: 20231018
Implantable Lead Location: 753859
Implantable Lead Location: 753860
Implantable Lead Model: 377
Implantable Lead Model: 377
Implantable Lead Serial Number: 8001068059
Implantable Lead Serial Number: 8001109171
Implantable Pulse Generator Implant Date: 20231018
Pulse Gen Model: 407145
Pulse Gen Serial Number: 1000094833

## 2022-08-01 ENCOUNTER — Encounter: Payer: Self-pay | Admitting: Internal Medicine

## 2022-08-04 NOTE — Progress Notes (Signed)
Remote pacemaker transmission.   

## 2022-10-10 ENCOUNTER — Other Ambulatory Visit: Payer: Self-pay | Admitting: Physician Assistant

## 2022-10-10 DIAGNOSIS — I4589 Other specified conduction disorders: Secondary | ICD-10-CM

## 2022-10-11 ENCOUNTER — Other Ambulatory Visit: Payer: Self-pay

## 2022-10-11 DIAGNOSIS — I4589 Other specified conduction disorders: Secondary | ICD-10-CM

## 2022-10-11 MED ORDER — METOPROLOL SUCCINATE ER 50 MG PO TB24: 50.0000 mg | ORAL_TABLET | Freq: Every day | ORAL | 0 refills | Status: DC

## 2022-10-21 ENCOUNTER — Ambulatory Visit (INDEPENDENT_AMBULATORY_CARE_PROVIDER_SITE_OTHER): Payer: PPO

## 2022-10-21 DIAGNOSIS — I4892 Unspecified atrial flutter: Secondary | ICD-10-CM | POA: Diagnosis not present

## 2022-10-23 LAB — CUP PACEART REMOTE DEVICE CHECK
Battery Remaining Percentage: 90 %
Brady Statistic RA Percent Paced: 95 %
Brady Statistic RV Percent Paced: 4 %
Date Time Interrogation Session: 20241018065957
Implantable Lead Connection Status: 753985
Implantable Lead Connection Status: 753985
Implantable Lead Implant Date: 20231018
Implantable Lead Implant Date: 20231018
Implantable Lead Location: 753859
Implantable Lead Location: 753860
Implantable Lead Model: 377
Implantable Lead Model: 377
Implantable Lead Serial Number: 8001068059
Implantable Lead Serial Number: 8001109171
Implantable Pulse Generator Implant Date: 20231018
Lead Channel Impedance Value: 624 Ohm
Lead Channel Impedance Value: 644 Ohm
Lead Channel Pacing Threshold Amplitude: 0.6 V
Lead Channel Pacing Threshold Amplitude: 0.9 V
Lead Channel Pacing Threshold Pulse Width: 0.4 ms
Lead Channel Pacing Threshold Pulse Width: 0.4 ms
Lead Channel Sensing Intrinsic Amplitude: 0.2 mV
Lead Channel Sensing Intrinsic Amplitude: 8.7 mV
Lead Channel Setting Pacing Amplitude: 2 V
Lead Channel Setting Pacing Amplitude: 2 V
Lead Channel Setting Pacing Pulse Width: 0.4 ms
Pulse Gen Model: 407145
Pulse Gen Serial Number: 1000094833

## 2022-10-27 ENCOUNTER — Encounter: Payer: Self-pay | Admitting: Internal Medicine

## 2022-11-04 NOTE — Progress Notes (Signed)
Remote pacemaker transmission.   

## 2022-11-29 DIAGNOSIS — L720 Epidermal cyst: Secondary | ICD-10-CM | POA: Diagnosis not present

## 2022-11-29 DIAGNOSIS — Z85828 Personal history of other malignant neoplasm of skin: Secondary | ICD-10-CM | POA: Diagnosis not present

## 2022-11-29 DIAGNOSIS — L570 Actinic keratosis: Secondary | ICD-10-CM | POA: Diagnosis not present

## 2022-11-29 DIAGNOSIS — L821 Other seborrheic keratosis: Secondary | ICD-10-CM | POA: Diagnosis not present

## 2022-11-29 DIAGNOSIS — D692 Other nonthrombocytopenic purpura: Secondary | ICD-10-CM | POA: Diagnosis not present

## 2023-01-09 ENCOUNTER — Other Ambulatory Visit: Payer: Self-pay | Admitting: Internal Medicine

## 2023-01-09 DIAGNOSIS — I4589 Other specified conduction disorders: Secondary | ICD-10-CM

## 2023-01-10 ENCOUNTER — Telehealth: Payer: Self-pay | Admitting: Internal Medicine

## 2023-01-10 DIAGNOSIS — H04123 Dry eye syndrome of bilateral lacrimal glands: Secondary | ICD-10-CM | POA: Diagnosis not present

## 2023-01-10 DIAGNOSIS — H02834 Dermatochalasis of left upper eyelid: Secondary | ICD-10-CM | POA: Diagnosis not present

## 2023-01-10 DIAGNOSIS — H43393 Other vitreous opacities, bilateral: Secondary | ICD-10-CM | POA: Diagnosis not present

## 2023-01-10 DIAGNOSIS — H2513 Age-related nuclear cataract, bilateral: Secondary | ICD-10-CM | POA: Diagnosis not present

## 2023-01-10 DIAGNOSIS — I4589 Other specified conduction disorders: Secondary | ICD-10-CM

## 2023-01-10 DIAGNOSIS — H02831 Dermatochalasis of right upper eyelid: Secondary | ICD-10-CM | POA: Diagnosis not present

## 2023-01-10 DIAGNOSIS — H353121 Nonexudative age-related macular degeneration, left eye, early dry stage: Secondary | ICD-10-CM | POA: Diagnosis not present

## 2023-01-10 DIAGNOSIS — H43813 Vitreous degeneration, bilateral: Secondary | ICD-10-CM | POA: Diagnosis not present

## 2023-01-10 MED ORDER — METOPROLOL SUCCINATE ER 50 MG PO TB24: 50.0000 mg | ORAL_TABLET | Freq: Every day | ORAL | 0 refills | Status: DC

## 2023-01-10 NOTE — Telephone Encounter (Signed)
*  STAT* If patient is at the pharmacy, call can be transferred to refill team.   1. Which medications need to be refilled? (please list name of each medication and dose if known)   metoprolol  succinate (TOPROL  XL) 50 MG 24 hr tablet   2. Would you like to learn more about the convenience, safety, & potential cost savings by using the [facility]?   3. Are you open to using the [facility] (Type [facility]. ).  4. Which pharmacy/location (including street and city if local pharmacy) is medication to be sent to?  [facility] - [place], [place] - [address]   5. Do they need a 30 day or 90 day supply?   90 day  Patient stated he is completely out of this medication.

## 2023-01-20 ENCOUNTER — Ambulatory Visit: Payer: PPO

## 2023-01-20 DIAGNOSIS — I4892 Unspecified atrial flutter: Secondary | ICD-10-CM

## 2023-01-20 LAB — CUP PACEART REMOTE DEVICE CHECK
Date Time Interrogation Session: 20250117094250
Implantable Lead Connection Status: 753985
Implantable Lead Connection Status: 753985
Implantable Lead Implant Date: 20231018
Implantable Lead Implant Date: 20231018
Implantable Lead Location: 753859
Implantable Lead Location: 753860
Implantable Lead Model: 377
Implantable Lead Model: 377
Implantable Lead Serial Number: 8001068059
Implantable Lead Serial Number: 8001109171
Implantable Pulse Generator Implant Date: 20231018
Pulse Gen Model: 407145
Pulse Gen Serial Number: 1000094833

## 2023-01-23 ENCOUNTER — Encounter: Payer: Self-pay | Admitting: Internal Medicine

## 2023-01-30 ENCOUNTER — Other Ambulatory Visit: Payer: Self-pay | Admitting: Internal Medicine

## 2023-02-07 ENCOUNTER — Other Ambulatory Visit: Payer: Self-pay

## 2023-02-07 MED ORDER — FLECAINIDE ACETATE 50 MG PO TABS: 50.0000 mg | ORAL_TABLET | Freq: Two times a day (BID) | ORAL | 0 refills | Status: DC

## 2023-02-10 ENCOUNTER — Other Ambulatory Visit: Payer: Self-pay | Admitting: Internal Medicine

## 2023-02-10 DIAGNOSIS — I4589 Other specified conduction disorders: Secondary | ICD-10-CM

## 2023-02-24 NOTE — Progress Notes (Signed)
 Remote pacemaker transmission.

## 2023-02-26 NOTE — Progress Notes (Unsigned)
 Electrophysiology Office Note:   Date:  02/27/2023  ID:  Jay Ayers, DOB 04-03-1945, MRN [id number]  Primary Cardiologist: Lesleigh Noe, MD (Inactive) Primary Heart Failure: None Electrophysiologist: Lewayne Bunting, MD       History of Present Illness:   Jay Ayers is a 78 y.o. male with h/o chronotropic incompetence s/p PPM, HFpEF, AFL, PVC's PMR, prostate cancer seen today for routine electrophysiology followup.   Since last being seen in our clinic the patient reports he remains active > he continues to lift weights & walks 1 1/4 mile per day.  He notes he gets a tightness in his left chest and shoulder and arm when he walks.   He has this 2-3 times per week when walking. He occasionally gets short of breath with the episodes.  He has no issues with his flecainide or Toprol.    He denies  palpitations, PND, orthopnea, nausea, vomiting, dizziness, syncope, edema, weight gain, or early satiety.   Review of systems complete and found to be negative unless listed in HPI.   EP Information / Studies Reviewed:    EKG is ordered today. Personal review as below.  EKG Interpretation Date/Time:  Monday February 27 2023 14:44:39 EST Ventricular Rate:  72 PR Interval:  254 QRS Duration:  90 QT Interval:  386 QTC Calculation: 422 R Axis:   257  Text Interpretation: Atrial-paced rhythm with prolonged AV conduction Confirmed by Canary Brim ([id number]) on 02/27/2023 3:10:19 PM  PR interval measures ~220 ms by manual measurement   PPM Interrogation-  reviewed in detail today,  See PACEART report.  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 The patient's score is based upon: CHF History: 1 HTN History: 0 Diabetes History: 0 Stroke History: 0 Vascular Disease History: 0 Age Score: 2 Gender Score: 0        STOP-Bang Score:          Physical Exam:   VS:  BP 118/62   Pulse 72   Ht 5\' 9"  (1.753 m)   Wt 157 lb 9.6 oz (71.5 kg)   SpO2 97%   BMI 23.27 kg/m    Wt Readings from Last 3 Encounters:  02/27/23 157 lb 9.6 oz (71.5 kg)  02/08/22 158 lb 12.8 oz (72 kg)  01/25/22 156 lb (70.8 kg)     GEN: Well nourished, well developed in no acute distress NECK: No JVD; No carotid bruits CARDIAC: Regular rate and rhythm, no murmurs, rubs, gallops RESPIRATORY:  Clear to auscultation without rales, wheezing or rhonchi  ABDOMEN: Soft, non-tender, non-distended EXTREMITIES:  No edema; No deformity   ASSESSMENT AND PLAN:    SND s/p Biotronik PPM  -Normal PPM function -See Pace Art report -initially changed CLS from medium to high and resting CLS from +20 to +30 bpm > pt initially left the office and returned back to the lobby due to "feeling funny".  We repeated and EKG which showed increased PR to 306 ms, HR 91 bpm.  Pt turned back to prior settings.  Will follow up in one month with  industry and ambulation to assess CLS vs rate response. Suspect this is AV decremental slowing with increased rate prolonging the PR   Paroxysmal AF  -0% burden on device  -continue beta blocker / flecainide (initiated 2024 for PVC's)  -EKG on flecainide stable prior to transient change in CLS (which was reprogrammed to prior settings) -pt has elected not to go on OAC in past, confirms it was due to taking prednisone at the time and "thin skin"  PVC's  Palpitations  -continue flecainide 50 mg BID -Toprol 50 mg daily  -2% burden on device (previously 11%)   Chest Tightness / SOB  -with walking / exertion, tightness in shoulder / left arm, some SOB  -assess lexi-scan stress test  -prior LHC /  ECHO as above    Informed Consent   Shared Decision Making/Informed Consent The risks [chest pain, shortness of breath, cardiac arrhythmias, dizziness, blood pressure fluctuations, myocardial infarction, stroke/transient ischemic attack, nausea, vomiting, allergic reaction, radiation exposure, metallic taste sensation and life-threatening complications (estimated to be 1 in 10,000)], benefits (risk stratification, diagnosing coronary artery disease, treatment guidance) and alternatives of a nuclear stress test were discussed in detail with Mr. Weida and he agrees to proceed.      Disposition:   Follow up with EP APP in 4 weeks  Signed, Canary Brim, NP-C, AGACNP-BC [facility] - Electrophysiology  02/27/2023, 5:47 PM

## 2023-02-27 ENCOUNTER — Encounter: Payer: Self-pay | Admitting: *Deleted

## 2023-02-27 ENCOUNTER — Encounter: Payer: Self-pay | Admitting: Pulmonary Disease

## 2023-02-27 ENCOUNTER — Ambulatory Visit: Payer: PPO | Attending: Pulmonary Disease | Admitting: Pulmonary Disease

## 2023-02-27 VITALS — BP 118/62 | HR 72 | Ht 69.0 in | Wt 157.6 lb

## 2023-02-27 DIAGNOSIS — R072 Precordial pain: Secondary | ICD-10-CM

## 2023-02-27 DIAGNOSIS — I493 Ventricular premature depolarization: Secondary | ICD-10-CM

## 2023-02-27 DIAGNOSIS — I480 Paroxysmal atrial fibrillation: Secondary | ICD-10-CM

## 2023-02-27 DIAGNOSIS — I495 Sick sinus syndrome: Secondary | ICD-10-CM | POA: Diagnosis not present

## 2023-02-27 DIAGNOSIS — Z950 Presence of cardiac pacemaker: Secondary | ICD-10-CM | POA: Diagnosis not present

## 2023-02-27 DIAGNOSIS — R002 Palpitations: Secondary | ICD-10-CM

## 2023-02-27 DIAGNOSIS — I4589 Other specified conduction disorders: Secondary | ICD-10-CM | POA: Diagnosis not present

## 2023-02-27 LAB — CUP PACEART INCLINIC DEVICE CHECK
Date Time Interrogation Session: 20250224182625
Implantable Lead Connection Status: 753985
Implantable Lead Connection Status: 753985
Implantable Lead Implant Date: 20231018
Implantable Lead Implant Date: 20231018
Implantable Lead Location: 753859
Implantable Lead Location: 753860
Implantable Lead Model: 377
Implantable Lead Model: 377
Implantable Lead Serial Number: 8001068059
Implantable Lead Serial Number: 8001109171
Implantable Pulse Generator Implant Date: 20231018
Pulse Gen Model: 407145
Pulse Gen Serial Number: 1000094833

## 2023-02-27 NOTE — Patient Instructions (Signed)
 Medication Instructions:  Your physician recommends that you continue on your current medications as directed. Please refer to the Current Medication list given to you today.  *If you need a refill on your cardiac medications before your next appointment, please call your pharmacy*  Lab Work: BMET, CBC-TODAY If you have labs (blood work) drawn today and your tests are completely normal, you will receive your results only by: MyChart Message (if you have MyChart) OR A paper copy in the mail If you have any lab test that is abnormal or we need to change your treatment, we will call you to review the results.  Testing/Procedures: Your physician has requested that you have a lexiscan myoview. For further information please visit [email or web address] Please follow instruction sheet, as given.   Follow-Up: At [facility], you and your health needs are our priority.  As part of our continuing mission to provide you with exceptional heart care, we have created designated Provider Care Teams.  These Care Teams include your primary Cardiologist (physician) and Advanced Practice Providers (APPs -  Physician Assistants and Nurse Practitioners) who all work together to provide you with the care you need, when you need it.  Your next appointment:   1 month(s)  Provider:   Canary Brim, NP   Then 1 yr with Canary Brim, NP

## 2023-02-28 ENCOUNTER — Encounter (HOSPITAL_COMMUNITY): Payer: Self-pay

## 2023-03-03 ENCOUNTER — Ambulatory Visit (HOSPITAL_COMMUNITY): Payer: PPO | Attending: Pulmonary Disease

## 2023-03-03 DIAGNOSIS — R072 Precordial pain: Secondary | ICD-10-CM

## 2023-03-03 LAB — MYOCARDIAL PERFUSION IMAGING
LV dias vol: 84 mL (ref 62–150)
LV sys vol: 39 mL
Nuc Stress EF: 54 %
Peak HR: 80 {beats}/min
Rest HR: 76 {beats}/min
Rest Nuclear Isotope Dose: 10.2 mCi
SDS: 1
SRS: 0
SSS: 1
ST Depression (mm): 0 mm
Stress Nuclear Isotope Dose: 29.5 mCi
TID: 1.01

## 2023-03-03 MED ORDER — REGADENOSON 0.4 MG/5ML IV SOLN
0.4000 mg | Freq: Once | INTRAVENOUS | Status: AC
  Administered 2023-03-03: 0.4 mg via INTRAVENOUS

## 2023-03-03 MED ORDER — TECHNETIUM TC 99M TETROFOSMIN IV KIT
29.5000 | PACK | Freq: Once | INTRAVENOUS | Status: AC | PRN
  Administered 2023-03-03: 29.5 via INTRAVENOUS

## 2023-03-03 MED ORDER — TECHNETIUM TC 99M TETROFOSMIN IV KIT
10.2000 | PACK | Freq: Once | INTRAVENOUS | Status: AC | PRN
  Administered 2023-03-03: 10.2 via INTRAVENOUS

## 2023-03-06 ENCOUNTER — Telehealth: Payer: Self-pay

## 2023-03-06 NOTE — Telephone Encounter (Signed)
-----   Message from Canary Brim sent at 03/06/2023  8:16 AM EST ----- Randie Heinz news! Your imaging does not show any evidence of ischemia (heart blockages) that would be causing the chest tightness you were feeling with walking!  We can discuss the pacemaker changes again at your next visit.  See you soon!

## 2023-03-06 NOTE — Telephone Encounter (Signed)
 Left voicemail to return call to office

## 2023-03-06 NOTE — Telephone Encounter (Signed)
 Copied from CRM [id number]. Topic: Clinical - Request for Lab/Test Order >> Mar 06, 2023  2:09 PM Martha Clan wrote: Reason for CRM: Patient received orders from the cardiac clinic for blood work. Patient is requesting an appointment to get those completed.

## 2023-03-07 ENCOUNTER — Telehealth: Payer: Self-pay

## 2023-03-07 ENCOUNTER — Other Ambulatory Visit: Payer: Self-pay | Admitting: Internal Medicine

## 2023-03-07 DIAGNOSIS — I4589 Other specified conduction disorders: Secondary | ICD-10-CM

## 2023-03-07 NOTE — Telephone Encounter (Signed)
 Copied from CRM [id number]. Topic: Appointments - Appointment Scheduling >> Mar 07, 2023  9:05 AM Jay Ayers wrote: Patient/patient representative is calling to schedule an appointment. Refer to attachments for appointment information. Patient is needing an appointment to have blood work done, patients callback number is [phone]

## 2023-03-07 NOTE — Telephone Encounter (Signed)
 Spoke with patient and he is aware of results. He verbalized understanding

## 2023-03-07 NOTE — Telephone Encounter (Signed)
 Follow Up:      Patient is returning a call from yesterday.

## 2023-03-08 ENCOUNTER — Telehealth: Payer: Self-pay | Admitting: *Deleted

## 2023-03-08 DIAGNOSIS — Z950 Presence of cardiac pacemaker: Secondary | ICD-10-CM | POA: Diagnosis not present

## 2023-03-08 DIAGNOSIS — I493 Ventricular premature depolarization: Secondary | ICD-10-CM | POA: Diagnosis not present

## 2023-03-08 DIAGNOSIS — I480 Paroxysmal atrial fibrillation: Secondary | ICD-10-CM | POA: Diagnosis not present

## 2023-03-08 DIAGNOSIS — R002 Palpitations: Secondary | ICD-10-CM | POA: Diagnosis not present

## 2023-03-08 DIAGNOSIS — I495 Sick sinus syndrome: Secondary | ICD-10-CM | POA: Diagnosis not present

## 2023-03-08 DIAGNOSIS — I4589 Other specified conduction disorders: Secondary | ICD-10-CM | POA: Diagnosis not present

## 2023-03-08 DIAGNOSIS — R072 Precordial pain: Secondary | ICD-10-CM | POA: Diagnosis not present

## 2023-03-08 NOTE — Telephone Encounter (Signed)
 Pt returned call

## 2023-03-08 NOTE — Telephone Encounter (Signed)
 LVM for pt to call office so I can touch base with him about his request to have labs.

## 2023-03-08 NOTE — Telephone Encounter (Signed)
 Contacted pt and informed him that we did receive two messages on his behalf however since he is not our patient he will need to reach out to his PCP to see where he may can go to have these completed.  Informed him that I had sent a message to the agents to reach out to him to let him know this but did not see any communication so I called him to inform him.

## 2023-03-08 NOTE — Telephone Encounter (Signed)
 Copied from CRM [id number]. Topic: General - Other >> Mar 08, 2023 12:57 PM Jay Ayers wrote: Reason for CRM: The patient was attempting to Lamonte Sakai, Byren Pankow D, CMA regarding possibly scheduling labs at the practice. I reached out to [facility] twice and they were assisting other patients at this time. His call back number is [phone].

## 2023-03-09 LAB — BASIC METABOLIC PANEL
BUN/Creatinine Ratio: 14 (ref 10–24)
BUN: 14 mg/dL (ref 8–27)
CO2: 24 mmol/L (ref 20–29)
Calcium: 9.4 mg/dL (ref 8.6–10.2)
Chloride: 95 mmol/L — ABNORMAL LOW (ref 96–106)
Creatinine, Ser: 0.97 mg/dL (ref 0.76–1.27)
Glucose: 88 mg/dL (ref 70–99)
Potassium: 4.8 mmol/L (ref 3.5–5.2)
Sodium: 131 mmol/L — ABNORMAL LOW (ref 134–144)
eGFR: 80 mL/min/{1.73_m2} (ref >59)

## 2023-03-09 LAB — CBC
Hematocrit: 44.7 % (ref 37.5–51.0)
Hemoglobin: 14.8 g/dL (ref 13.0–17.7)
MCH: 31.8 pg (ref 26.6–33.0)
MCHC: 33.1 g/dL (ref 31.5–35.7)
MCV: 96 fL (ref 79–97)
Platelets: 252 10*3/uL (ref 150–450)
RBC: 4.66 x10E6/uL (ref 4.14–5.80)
RDW: 12.2 % (ref 11.6–15.4)
WBC: 5.8 10*3/uL (ref 3.4–10.8)

## 2023-03-28 NOTE — Progress Notes (Unsigned)
 Electrophysiology Office Note:   Date:  03/29/2023  ID:  THATCHER DOBERSTEIN, DOB [birth year], MRN [id number]  Primary Cardiologist: Lesleigh Noe, MD (Inactive) Primary Heart Failure: None Electrophysiologist: Lewayne Bunting, MD       History of Present Illness:   Jay Ayers is a 78 y.o. male with h/o chronotropic incompetence s/p PPM, HFpEF, AFL, PVC's PMR, prostate cancer seen today for routine electrophysiology followup.   Since last being seen in our clinic the patient reports he continues to have shortness of breath at times with chest tightness during exertion. He denies ever smoking. Worked in Chief Financial Officer for [employer].  He reports he has occasional flutters that he suspects are PVC's.  He works out with [employer] in a chair and walks 1 1/4 mile per day.  He had similar c/o's at last office visit and we assessed a NM stress test which was negative.  He denies PND, orthopnea, nausea, vomiting, dizziness, syncope, edema, weight gain, or early satiety.   Review of systems complete and found to be negative unless listed in HPI.   EP Information / Studies Reviewed:    EKG is ordered today. Personal review as below.  EKG Interpretation Date/Time:  Wednesday March 29 2023 10:29:37 EDT Ventricular Rate:  67 PR Interval:  264 QRS Duration:  86 QT Interval:  376 QTC Calculation: 397 R Axis:   -73  Text Interpretation: Atrial-paced rhythm with prolonged AV conduction Left anterior fascicular block Confirmed by Canary Brim ([id number]) on 03/29/2023 5:28:52 PM   PPM Interrogation-  reviewed in detail today,  See PACEART report.  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 The patient's score is based upon: CHF History: 1 HTN History: 0 Diabetes History: 0 Stroke History: 0 Vascular Disease History: 0 Age Score: 2 Gender Score: 0        STOP-Bang Score:          Physical Exam:   VS:  BP 114/68   Pulse 67   Ht 5\' 9"  (1.753 m)   Wt 158 lb 11.2 oz (72 kg)   SpO2 98%   BMI 23.44 kg/m    Wt Readings from Last 3 Encounters:  03/29/23 158 lb 11.2 oz (72 kg)  03/03/23 157 lb (71.2 kg)  02/27/23 157 lb 9.6 oz (71.5 kg)     GEN: Well nourished, well developed in no acute distress NECK: No JVD; No carotid bruits CARDIAC: Regular rate and rhythm, no murmurs, rubs, gallops RESPIRATORY:  Clear to auscultation without rales, wheezing or rhonchi  ABDOMEN: Soft, non-tender, non-distended EXTREMITIES:  No edema; No deformity   ASSESSMENT AND PLAN:    SND s/p Biotronik PPM  -Normal PPM function -See Pace Art report -No changes today -sensor rate noted to be primarily 60 bpm, occasionally does have HR binned in 130 bpm > could be PVC's -given HR largely at 60 bpm, we did not increase CLS + for resting HR / activity   Paroxysmal Atrial Fibrillation  CHA2DS2-VASc  3  -no AT/AF episodes  -continue beta blocker / flecainide as below  -EKG with NSR stable intervals on flecainide  -pt has elected in past not to go on [facility]   PVC's  Palpitations  -continue flecainide 50 mg BID  -Toprol 50 mg daily  -no PVC's noted on device interrogation today  Chest Tightness / Shortness of Breath  -with walking / exertion, tightness in shoulder / left arm with some SOB  -Lexi-Scan as above, reassuring   -offered Pulmonary evaluation for PFT's but pt declined  Disposition:   Follow up with Dr. Ladona Ridgel in 12 months  Signed, Canary Brim, NP-C, AGACNP-BC [facility] - Electrophysiology   03/29/2023, 5:35 PM

## 2023-03-29 ENCOUNTER — Ambulatory Visit: Payer: PPO | Attending: Pulmonary Disease | Admitting: Pulmonary Disease

## 2023-03-29 ENCOUNTER — Encounter: Payer: Self-pay | Admitting: Pulmonary Disease

## 2023-03-29 VITALS — BP 114/68 | HR 67 | Ht 69.0 in | Wt 158.7 lb

## 2023-03-29 DIAGNOSIS — I493 Ventricular premature depolarization: Secondary | ICD-10-CM | POA: Diagnosis not present

## 2023-03-29 DIAGNOSIS — I4589 Other specified conduction disorders: Secondary | ICD-10-CM

## 2023-03-29 DIAGNOSIS — I495 Sick sinus syndrome: Secondary | ICD-10-CM | POA: Diagnosis not present

## 2023-03-29 DIAGNOSIS — I4892 Unspecified atrial flutter: Secondary | ICD-10-CM | POA: Diagnosis not present

## 2023-03-29 DIAGNOSIS — Z950 Presence of cardiac pacemaker: Secondary | ICD-10-CM

## 2023-03-29 LAB — CUP PACEART INCLINIC DEVICE CHECK
Date Time Interrogation Session: 20250326173009
Implantable Lead Connection Status: 753985
Implantable Lead Connection Status: 753985
Implantable Lead Implant Date: 20231018
Implantable Lead Implant Date: 20231018
Implantable Lead Location: 753859
Implantable Lead Location: 753860
Implantable Lead Model: 377
Implantable Lead Model: 377
Implantable Lead Serial Number: 8001068059
Implantable Lead Serial Number: 8001109171
Implantable Pulse Generator Implant Date: 20231018
Pulse Gen Model: 407145
Pulse Gen Serial Number: 1000094833

## 2023-03-29 NOTE — Patient Instructions (Signed)
 Medication Instructions:   Your physician recommends that you continue on your current medications as directed. Please refer to the Current Medication list given to you today.  *If you need a refill on your cardiac medications before your next appointment, please call your pharmacy*   Lab Work: None ordered.  If you have labs (blood work) drawn today and your tests are completely normal, you will receive your results only by: MyChart Message (if you have MyChart) OR A paper copy in the mail If you have any lab test that is abnormal or we need to change your treatment, we will call you to review the results.   Testing/Procedures: None ordered.    Follow-Up: At [facility], you and your health needs are our priority.  As part of our continuing mission to provide you with exceptional heart care, we have created designated Provider Care Teams.  These Care Teams include your primary Cardiologist (physician) and Advanced Practice Providers (APPs -  Physician Assistants and Nurse Practitioners) who all work together to provide you with the care you need, when you need it.  We recommend signing up for the patient portal called "MyChart".  Sign up information is provided on this After Visit Summary.  MyChart is used to connect with patients for Virtual Visits (Telemedicine).  Patients are able to view lab/test results, encounter notes, upcoming appointments, etc.  Non-urgent messages can be sent to your provider as well.   To learn more about what you can do with MyChart, go to [email or web address].    Your next appointment:   As planned for 02/2024

## 2023-04-07 ENCOUNTER — Encounter (HOSPITAL_COMMUNITY): Payer: Self-pay | Admitting: *Deleted

## 2023-04-07 ENCOUNTER — Ambulatory Visit (HOSPITAL_COMMUNITY)
Admission: EM | Admit: 2023-04-07 | Discharge: 2023-04-07 | Disposition: A | Discharge status: Home / Self Care | Attending: Emergency Medicine | Admitting: Emergency Medicine

## 2023-04-07 DIAGNOSIS — Z203 Contact with and (suspected) exposure to rabies: Secondary | ICD-10-CM

## 2023-04-07 DIAGNOSIS — S81811A Laceration without foreign body, right lower leg, initial encounter: Secondary | ICD-10-CM | POA: Diagnosis not present

## 2023-04-07 MED ORDER — TETANUS-DIPHTH-ACELL PERTUSSIS 5-2.5-18.5 LF-MCG/0.5 IM SUSY
0.5000 mL | PREFILLED_SYRINGE | Freq: Once | INTRAMUSCULAR | Status: AC
  Administered 2023-04-07: 0.5 mL via INTRAMUSCULAR

## 2023-04-07 MED ORDER — LIDOCAINE-EPINEPHRINE-TETRACAINE (LET) TOPICAL GEL
TOPICAL | Status: AC
Start: 2023-04-07 — End: ?
  Filled 2023-04-07: qty 3

## 2023-04-07 MED ORDER — TETANUS-DIPHTH-ACELL PERTUSSIS 5-2.5-18.5 LF-MCG/0.5 IM SUSY
PREFILLED_SYRINGE | INTRAMUSCULAR | Status: AC
  Filled 2023-04-07: qty 0.5

## 2023-04-07 NOTE — ED Triage Notes (Signed)
 PT reports his dogs pronged chock collar . The prong hooked into his skin and the dog was running . The prong was tore across his leg.

## 2023-04-07 NOTE — Discharge Instructions (Signed)
 We have updated your tetanus vaccine today.  The topical lidocaine gel should start to numb the area in about 30 minutes You can use tylenol for pain. Elevate the leg and ice if needed for swelling.  I recommend antibiotic ointment twice daily Change dressing at least once daily You can wash normally with mild soap and water Try not to scratch or pull at the staples or they will come out.  Monitor for any signs of infection - increased pain, redness, swelling, or foul drainage. Please return if needed  Return in about 10 days for staple removal. (Monday April 14th)

## 2023-04-07 NOTE — ED Provider Notes (Signed)
 [facility]    CSN: [id number] Arrival date & time: 04/07/23  1744      History   Chief Complaint Chief Complaint  Patient presents with   Laceration    HPI Jay Ayers is a 78 y.o. male.  Here with right lower leg laceration that occurred just prior to arrival.  His dog's collar has prongs on it, dog ran and the prong scraped across his leg. Pain about 4/10 currently. No meds yet. Reports clotting disorder and easily bleeds.  Tdap was 2019 per chart review. Patient wife thinks it was longer ago than this.   Past Medical History:  Diagnosis Date   Arthritis    knees, back , shoulders. hx. 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/2022   Atrial flutter (HCC) 08/06/2021   Palpitations 08/06/2021   Dyspnea on exertion    Chest pain 06/30/2014   Neuromuscular disorder (HCC) 06/30/2014   Prostate cancer (HCC) 06/30/2014   Clotting disorder (HCC) 06/30/2014   Memory disturbance 08/06/2013    Past Surgical History:  Procedure Laterality Date   APPENDECTOMY  1958   BACK SURGERY  1995   COLONOSCOPY WITH PROPOFOL N/A 06/02/2015   Procedure: COLONOSCOPY WITH PROPOFOL;  Surgeon: Charolett Bumpers, MD;  Location: [facility];  Service: Endoscopy;  Laterality: N/A;   EYE SURGERY     "cyst on eye excised"   LEFT HEART CATH AND CORONARY ANGIOGRAPHY N/A 01/14/2019   Procedure: LEFT HEART CATH AND CORONARY ANGIOGRAPHY;  Surgeon: Lyn Records, MD;  Location: [facility];  Service: Cardiovascular;  Laterality: N/A;   MINOR NAILBED REPAIR Right 04/25/2013   Procedure: RIGHT THUMB  NAIL BIOPSY    (MINOR PROCEDURE);  Surgeon: Wyn Forster., MD;  Location: [facility];  Service: Orthopedics;  Laterality: Right;  clean class   PACEMAKER IMPLANT N/A 10/20/2021   Procedure: PACEMAKER IMPLANT;  Surgeon: Marinus Maw, MD;  Location: [facility];  Service: Cardiovascular;  Laterality: N/A;   PILONIDAL CYST EXCISION     PROSTATE SURGERY  2005   ROTATOR CUFF REPAIR Right 2002   SPINE SURGERY     only epidural injection in back after lumbar surgery several yrs ago   TONSILLECTOMY  1964   TRANSURETHRAL RESECTION OF PROSTATE  1998   VASECTOMY  1991     Home Medications    Prior to Admission medications   Medication Sig Start Date End Date Taking? Authorizing Provider  acetaminophen (TYLENOL) 500 MG tablet Take 500 mg by mouth daily as needed for headache or moderate pain.   Yes [provider]  flecainide (TAMBOCOR) 50 MG tablet Take 1 tablet (50 mg total) by mouth 2 (two) times daily. 03/09/23  Yes Marinus Maw, MD  metoprolol succinate (TOPROL-XL) 50 MG 24 hr tablet Take 1 tablet (50 mg total) by mouth daily. 03/09/23  Yes Marinus Maw, MD  Multiple Vitamin (MULTIVITAMIN) tablet Take 1 tablet by mouth daily.   Yes [provider]  omeprazole (PRILOSEC OTC) 20  MG tablet Take 20 mg by mouth daily.   Yes [provider]    Family History Family History  Problem Relation Age of Onset   Stroke Mother    Heart failure Mother    Emphysema Father    Multiple sclerosis Brother    Heart Problems Brother    Prostate cancer Brother    Leukemia Brother    Multiple sclerosis Other    Prostate cancer Other    Heart Problems Other     Social History Social History   Tobacco Use   Smoking status: Never   Smokeless tobacco: Never  Substance Use Topics   Alcohol use: Yes    Comment: occ   Drug use: No     Allergies   Nitrofurantoin   Review of Systems Review of Systems Per HPI  Physical Exam Triage Vital  Signs ED Triage Vitals  Encounter Vitals Group     BP 04/07/23 1809 122/74     Systolic BP Percentile --      Diastolic BP Percentile --      Pulse Rate 04/07/23 1809 69     Resp 04/07/23 1809 18     Temp 04/07/23 1809 97.9 F (36.6 C)     Temp src --      SpO2 04/07/23 1809 96 %     Weight --      Height --      Head Circumference --      Peak Flow --      Pain Score 04/07/23 1807 4     Pain Loc --      Pain Education --      Exclude from Growth Chart --    No data found.  Updated Vital Signs BP 122/74   Pulse 69   Temp 97.9 F (36.6 C)   Resp 18   SpO2 96%   Physical Exam Vitals and nursing note reviewed.  Constitutional:      General: He is not in acute distress.    Appearance: Normal appearance.  HENT:     Mouth/Throat:     Pharynx: Oropharynx is clear.  Cardiovascular:     Rate and Rhythm: Normal rate and regular rhythm.     Pulses: Normal pulses.     Heart sounds: Normal heart sounds.  Pulmonary:     Effort: Pulmonary effort is normal.     Breath sounds: Normal breath sounds.  Musculoskeletal:       Legs:     Comments: Full ROM of right lower extremity. Distal sensation intact, DP pulse 2+  Skin:    Findings: Laceration present.     Comments: Wedge shaped laceration on the right lateral calf, about 5 inches length, 2 inches wide. There is adipose tissue noted. Lightly bleeding, stops with direct pressure. Wound edges come together with manual pressure.   Neurological:     Mental Status: He is alert and oriented to person, place, and time.     UC Treatments / Results  Labs (all labs ordered are listed, but only abnormal results are displayed) Labs Reviewed - No data to display  EKG   Radiology No results found.  Procedures Laceration Repair  Date/Time: 04/07/2023 7:17 PM  Performed by: Marlow Baars, PA-C Authorized by: Marlow Baars, PA-C   Consent:    Consent obtained:  Verbal   Consent given by:  Patient   Risks, benefits, and  alternatives were discussed: yes     Risks discussed:  Infection, pain, need  for additional repair and poor cosmetic result Universal protocol:    Procedure explained and questions answered to patient or proxy's satisfaction: yes     Patient identity confirmed:  Verbally with patient Anesthesia:    Anesthesia method:  Topical application   Topical anesthetic:  LET Laceration details:    Location:  Leg   Leg location:  R lower leg   Length (cm):  12   Depth (mm):  0.5 Exploration:    Hemostasis achieved with:  Direct pressure   Imaging outcome: foreign body not noted     Wound exploration: entire depth of wound visualized     Contaminated: no   Treatment:    Area cleansed with:  Shur-Clens and saline   Amount of cleaning:  Standard   Irrigation solution:  Sterile saline   Irrigation method:  Syringe Skin repair:    Repair method:  Staples   Number of staples:  5 Approximation:    Approximation:  Close Repair type:    Repair type:  Simple Post-procedure details:    Dressing:  Antibiotic ointment, non-adherent dressing and bulky dressing   Procedure completion:  Tolerated well, no immediate complications   Medications Ordered in UC Medications  Tdap (BOOSTRIX) injection 0.5 mL (has no administration in time range)    Initial Impression / Assessment and Plan / UC Course  I have reviewed the triage vital signs and the nursing notes.  Pertinent labs & imaging results that were available during my care of the patient were reviewed by me and considered in my medical decision making (see chart for details).  Right lower leg wound, cleaned in clinic. Repaired with 5 staples as per procedure note above. Will return in 10 days for removal. Tdap updated today. Discussed wound care, signs of infection to monitor for. Advised reasons to return to clinic. Patient agrees to plan, no questions  Final Clinical Impressions(s) / UC Diagnoses   Final diagnoses:  Laceration of right lower  extremity, initial encounter     Discharge Instructions      We have updated your tetanus vaccine today.  The topical lidocaine gel should start to numb the area in about 30 minutes You can use tylenol for pain. Elevate the leg and ice if needed for swelling.  I recommend antibiotic ointment twice daily Change dressing at least once daily You can wash normally with mild soap and water Try not to scratch or pull at the staples or they will come out.  Monitor for any signs of infection - increased pain, redness, swelling, or foul drainage. Please return if needed  Return in about 10 days for staple removal. (Monday April 14th)      ED Prescriptions   None    PDMP not reviewed this encounter.   Kathrine Haddock 04/07/23 1925

## 2023-04-17 ENCOUNTER — Ambulatory Visit (HOSPITAL_COMMUNITY)
Admission: RE | Admit: 2023-04-17 | Discharge: 2023-04-17 | Disposition: A | Discharge status: Home / Self Care | Source: Ambulatory Visit | Attending: Family Medicine | Admitting: Family Medicine

## 2023-04-17 ENCOUNTER — Encounter (HOSPITAL_COMMUNITY): Payer: Self-pay

## 2023-04-17 VITALS — BP 119/73 | HR 69 | Temp 97.8°F | Resp 16

## 2023-04-17 DIAGNOSIS — T148XXA Other injury of unspecified body region, initial encounter: Secondary | ICD-10-CM | POA: Diagnosis not present

## 2023-04-17 DIAGNOSIS — L089 Local infection of the skin and subcutaneous tissue, unspecified: Secondary | ICD-10-CM

## 2023-04-17 MED ORDER — CEPHALEXIN 500 MG PO CAPS: 500.0000 mg | ORAL_CAPSULE | Freq: Two times a day (BID) | ORAL | 0 refills | Status: AC

## 2023-04-17 NOTE — ED Provider Notes (Signed)
 [facility]    CSN: [id number] Arrival date & time: 04/17/23  1025      History   Chief Complaint Chief Complaint  Patient presents with   Suture / Staple Removal    HPI Jay Ayers is a 78 y.o. male.    Suture / Staple Removal  Here for staple removal.  He has been doing wound care and has been applying triple antibiotic ointment twice daily.  It is a little more sensitive in the last couple of days.  No drainage from the wound. He is allergic to nitrofurantoin     Past Medical History:  Diagnosis Date   Arthritis    knees, back , shoulders. hx. 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/2022   Atrial flutter (HCC) 08/06/2021   Palpitations 08/06/2021   Dyspnea on exertion    Chest pain 06/30/2014   Neuromuscular disorder (HCC) 06/30/2014   Prostate cancer (HCC) 06/30/2014   Clotting disorder (HCC) 06/30/2014   Memory disturbance 08/06/2013    Past Surgical History:  Procedure Laterality Date   APPENDECTOMY  1958   BACK SURGERY  1995   COLONOSCOPY WITH PROPOFOL N/A 06/02/2015   Procedure: COLONOSCOPY WITH PROPOFOL;  Surgeon: Charolett Bumpers, MD;  Location: [facility];  Service: Endoscopy;  Laterality: N/A;   EYE SURGERY     "cyst on eye excised"   LEFT HEART CATH AND CORONARY ANGIOGRAPHY N/A 01/14/2019   Procedure: LEFT HEART CATH AND CORONARY ANGIOGRAPHY;  Surgeon: Lyn Records, MD;  [facility]: [facility];  Service: Cardiovascular;  Laterality: N/A;   MINOR NAILBED REPAIR Right 04/25/2013   Procedure: RIGHT THUMB NAIL BIOPSY    (MINOR PROCEDURE);   Surgeon: Wyn Forster., MD;  Location: [facility];  Service: Orthopedics;  Laterality: Right;  clean class   PACEMAKER IMPLANT N/A 10/20/2021   Procedure: PACEMAKER IMPLANT;  Surgeon: Marinus Maw, MD;  Location: [facility];  Service: Cardiovascular;  Laterality: N/A;   PILONIDAL CYST EXCISION     PROSTATE SURGERY  2005   ROTATOR CUFF REPAIR Right 2002   SPINE SURGERY     only epidural injection in back after lumbar surgery several yrs ago   TONSILLECTOMY  1964   TRANSURETHRAL RESECTION OF PROSTATE  1998   VASECTOMY  1991       Home Medications    Prior to Admission medications   Medication Sig Start Date End Date Taking? Authorizing Provider  cephALEXin (KEFLEX) 500 MG capsule Take 1 capsule (500 mg total) by mouth 2 (two) times daily for 7 days. 04/17/23 04/24/23 Yes Zenia Resides, MD  flecainide (TAMBOCOR) 50 MG tablet Take 1 tablet (50 mg total) by mouth 2 (two) times daily. 03/09/23  Yes Marinus Maw, MD  metoprolol succinate (TOPROL-XL) 50 MG 24 hr tablet Take 1 tablet (50 mg total) by mouth daily. 03/09/23  Yes Marinus Maw, MD  Multiple Vitamin (MULTIVITAMIN) tablet Take 1 tablet by mouth daily.   Yes [provider]  omeprazole (  PRILOSEC OTC) 20 MG tablet Take 20 mg by mouth daily.   Yes [provider]  acetaminophen (TYLENOL) 500 MG tablet Take 500 mg by mouth daily as needed for headache or moderate pain.    [provider]    Family History Family History  Problem Relation Age of Onset   Stroke Mother    Heart failure Mother    Emphysema Father    Multiple sclerosis Brother    Heart Problems Brother    Prostate cancer Brother    Leukemia Brother    Multiple sclerosis Other    Prostate cancer Other    Heart Problems Other     Social History Social History   Tobacco Use   Smoking status: Never   Smokeless tobacco: Never  Substance Use Topics   Alcohol use: Yes    Comment: occ   Drug use: No      Allergies   Nitrofurantoin   Review of Systems Review of Systems   Physical Exam Triage Vital Signs ED Triage Vitals [04/17/23 1048]  Encounter Vitals Group     BP      Systolic BP Percentile      Diastolic BP Percentile      Pulse      Resp      Temp      Temp src      SpO2      Weight      Height      Head Circumference      Peak Flow      Pain Score 0     Pain Loc      Pain Education      Exclude from Growth Chart    No data found.  Updated Vital Signs There were no vitals taken for this visit.  Visual Acuity Right Eye Distance:   Left Eye Distance:   Bilateral Distance:    Right Eye Near:   Left Eye Near:    Bilateral Near:     Physical Exam Vitals reviewed.  Constitutional:      General: He is not in acute distress.    Appearance: He is not ill-appearing, toxic-appearing or diaphoretic.  Skin:    Coloration: Skin is not jaundiced or pale.     Comments: There seems to be a good seal of the laceration.  There is pink skin on either side of it and it is mildly tender.  There is also some bruising posterior to the laceration.  Neurological:     Mental Status: He is alert and oriented to person, place, and time.  Psychiatric:        Behavior: Behavior normal.      UC Treatments / Results  Labs (all labs ordered are listed, but only abnormal results are displayed) Labs Reviewed - No data to display  EKG   Radiology No results found.  Procedures Procedures (including critical care time)  Medications Ordered in UC Medications - No data to display  Initial Impression / Assessment and Plan / UC Course  I have reviewed the triage vital signs and the nursing notes.  Pertinent labs & imaging results that were available during my care of the patient were reviewed by me and considered in my medical decision making (see chart for details).     Staff will put on Steri-Strips and take out the staples.  I do think he has a mild wound  infection, so Keflex is sent into treat. Final Clinical Impressions(s) / UC  Diagnoses   Final diagnoses:  Post-traumatic wound infection     Discharge Instructions      Take cephalexin 500 mg--1 capsule 2 times daily for 7 days.  The steri strips will fall off on their own in the next week. You can wash the area gently with soap and water when bathing     ED Prescriptions     Medication Sig Dispense Auth. Provider   cephALEXin (KEFLEX) 500 MG capsule Take 1 capsule (500 mg total) by mouth 2 (two) times daily for 7 days. 14 capsule Ellsworth Haas, Allyssia Skluzacek K, MD      PDMP not reviewed this encounter.   Ann Keto, MD 04/17/23 1050

## 2023-04-17 NOTE — ED Triage Notes (Signed)
 Pt states he has some redness and draining from his wound. He is here for staple removal today. Requesting provider to check site.

## 2023-04-17 NOTE — Discharge Instructions (Signed)
 Take cephalexin 500 mg--1 capsule 2 times daily for 7 days.  The steri strips will fall off on their own in the next week. You can wash the area gently with soap and water when bathing

## 2023-04-21 ENCOUNTER — Ambulatory Visit (INDEPENDENT_AMBULATORY_CARE_PROVIDER_SITE_OTHER): Payer: PPO

## 2023-04-21 DIAGNOSIS — I495 Sick sinus syndrome: Secondary | ICD-10-CM

## 2023-04-22 LAB — CUP PACEART REMOTE DEVICE CHECK
Date Time Interrogation Session: 20250418075107
Implantable Lead Connection Status: 753985
Implantable Lead Connection Status: 753985
Implantable Lead Implant Date: 20231018
Implantable Lead Implant Date: 20231018
Implantable Lead Location: 753859
Implantable Lead Location: 753860
Implantable Lead Model: 377
Implantable Lead Model: 377
Implantable Lead Serial Number: 8001068059
Implantable Lead Serial Number: 8001109171
Implantable Pulse Generator Implant Date: 20231018
Pulse Gen Model: 407145
Pulse Gen Serial Number: 1000094833

## 2023-04-25 ENCOUNTER — Encounter: Payer: Self-pay | Admitting: Internal Medicine

## 2023-05-02 DIAGNOSIS — E871 Hypo-osmolality and hyponatremia: Secondary | ICD-10-CM | POA: Diagnosis not present

## 2023-05-02 DIAGNOSIS — C61 Malignant neoplasm of prostate: Secondary | ICD-10-CM | POA: Diagnosis not present

## 2023-05-02 DIAGNOSIS — E786 Lipoprotein deficiency: Secondary | ICD-10-CM | POA: Diagnosis not present

## 2023-05-02 DIAGNOSIS — R7989 Other specified abnormal findings of blood chemistry: Secondary | ICD-10-CM | POA: Diagnosis not present

## 2023-05-02 DIAGNOSIS — Z1212 Encounter for screening for malignant neoplasm of rectum: Secondary | ICD-10-CM | POA: Diagnosis not present

## 2023-05-02 DIAGNOSIS — D72819 Decreased white blood cell count, unspecified: Secondary | ICD-10-CM | POA: Diagnosis not present

## 2023-05-02 DIAGNOSIS — N401 Enlarged prostate with lower urinary tract symptoms: Secondary | ICD-10-CM | POA: Diagnosis not present

## 2023-05-04 DIAGNOSIS — G3184 Mild cognitive impairment, so stated: Secondary | ICD-10-CM | POA: Diagnosis not present

## 2023-05-04 DIAGNOSIS — Z0000 Encounter for general adult medical examination without abnormal findings: Secondary | ICD-10-CM | POA: Diagnosis not present

## 2023-05-04 DIAGNOSIS — E871 Hypo-osmolality and hyponatremia: Secondary | ICD-10-CM | POA: Diagnosis not present

## 2023-05-04 DIAGNOSIS — I5189 Other ill-defined heart diseases: Secondary | ICD-10-CM | POA: Diagnosis not present

## 2023-05-04 DIAGNOSIS — R0789 Other chest pain: Secondary | ICD-10-CM | POA: Diagnosis not present

## 2023-05-04 DIAGNOSIS — R0602 Shortness of breath: Secondary | ICD-10-CM | POA: Diagnosis not present

## 2023-05-04 DIAGNOSIS — I4892 Unspecified atrial flutter: Secondary | ICD-10-CM | POA: Diagnosis not present

## 2023-05-04 DIAGNOSIS — R82998 Other abnormal findings in urine: Secondary | ICD-10-CM | POA: Diagnosis not present

## 2023-05-04 DIAGNOSIS — Z1331 Encounter for screening for depression: Secondary | ICD-10-CM | POA: Diagnosis not present

## 2023-05-04 DIAGNOSIS — Z1339 Encounter for screening examination for other mental health and behavioral disorders: Secondary | ICD-10-CM | POA: Diagnosis not present

## 2023-05-04 DIAGNOSIS — M353 Polymyalgia rheumatica: Secondary | ICD-10-CM | POA: Diagnosis not present

## 2023-05-04 DIAGNOSIS — N401 Enlarged prostate with lower urinary tract symptoms: Secondary | ICD-10-CM | POA: Diagnosis not present

## 2023-05-04 DIAGNOSIS — I341 Nonrheumatic mitral (valve) prolapse: Secondary | ICD-10-CM | POA: Diagnosis not present

## 2023-05-09 DIAGNOSIS — Z85828 Personal history of other malignant neoplasm of skin: Secondary | ICD-10-CM | POA: Diagnosis not present

## 2023-05-09 DIAGNOSIS — L820 Inflamed seborrheic keratosis: Secondary | ICD-10-CM | POA: Diagnosis not present

## 2023-05-09 DIAGNOSIS — L821 Other seborrheic keratosis: Secondary | ICD-10-CM | POA: Diagnosis not present

## 2023-05-16 ENCOUNTER — Telehealth: Payer: Self-pay

## 2023-05-16 NOTE — Telephone Encounter (Signed)
 Called patient to advise Jay Ayers did not call him and unable to see any notes in EP[employer] to see reason as to why call could have been placed. Patient advised if anything changes we will contact him.Pt was appreciative of call back.

## 2023-05-16 NOTE — Telephone Encounter (Signed)
 Pt states Howell Macintosh gave him a call yesterday however, I did not see a phone note to go by. I told him I will have her give him a call back.

## 2023-05-16 NOTE — Telephone Encounter (Signed)
Attempted to contact patient. No answer, left message to call back

## 2023-05-31 NOTE — Addendum Note (Signed)
 Addended by: Lott Rouleau A on: 05/31/2023 09:06 AM   Modules accepted: Orders

## 2023-05-31 NOTE — Progress Notes (Signed)
 Remote pacemaker transmission.

## 2023-06-13 DIAGNOSIS — L820 Inflamed seborrheic keratosis: Secondary | ICD-10-CM | POA: Diagnosis not present

## 2023-06-13 DIAGNOSIS — Z85828 Personal history of other malignant neoplasm of skin: Secondary | ICD-10-CM | POA: Diagnosis not present

## 2023-06-13 DIAGNOSIS — L821 Other seborrheic keratosis: Secondary | ICD-10-CM | POA: Diagnosis not present

## 2023-06-13 DIAGNOSIS — S80811A Abrasion, right lower leg, initial encounter: Secondary | ICD-10-CM | POA: Diagnosis not present

## 2023-07-21 ENCOUNTER — Ambulatory Visit: Payer: PPO

## 2023-07-21 DIAGNOSIS — I495 Sick sinus syndrome: Secondary | ICD-10-CM | POA: Diagnosis not present

## 2023-07-24 LAB — CUP PACEART REMOTE DEVICE CHECK
Date Time Interrogation Session: 20250718122724
Implantable Lead Connection Status: 753985
Implantable Lead Connection Status: 753985
Implantable Lead Implant Date: 20231018
Implantable Lead Implant Date: 20231018
Implantable Lead Location: 753859
Implantable Lead Location: 753860
Implantable Lead Model: 377
Implantable Lead Model: 377
Implantable Lead Serial Number: 8001068059
Implantable Lead Serial Number: 8001109171
Implantable Pulse Generator Implant Date: 20231018
Pulse Gen Model: 407145
Pulse Gen Serial Number: 1000094833

## 2023-07-26 ENCOUNTER — Ambulatory Visit: Payer: Self-pay | Admitting: Internal Medicine

## 2023-10-09 NOTE — Progress Notes (Signed)
 Remote PPM Transmission

## 2023-10-17 ENCOUNTER — Encounter: Payer: Self-pay | Admitting: Internal Medicine

## 2023-10-17 DIAGNOSIS — M79604 Pain in right leg: Secondary | ICD-10-CM

## 2023-10-20 ENCOUNTER — Ambulatory Visit (INDEPENDENT_AMBULATORY_CARE_PROVIDER_SITE_OTHER): Payer: PPO

## 2023-10-20 DIAGNOSIS — I495 Sick sinus syndrome: Secondary | ICD-10-CM

## 2023-10-25 LAB — CUP PACEART REMOTE DEVICE CHECK
Battery Voltage: 85
Date Time Interrogation Session: 20251017074231
Implantable Lead Connection Status: 753985
Implantable Lead Connection Status: 753985
Implantable Lead Implant Date: 20231018
Implantable Lead Implant Date: 20231018
Implantable Lead Location: 753859
Implantable Lead Location: 753860
Implantable Lead Model: 377
Implantable Lead Model: 377
Implantable Lead Serial Number: 8001068059
Implantable Lead Serial Number: 8001109171
Implantable Pulse Generator Implant Date: 20231018
Pulse Gen Model: 407145
Pulse Gen Serial Number: 1000094833

## 2023-10-26 ENCOUNTER — Ambulatory Visit (HOSPITAL_COMMUNITY)
Admission: RE | Admit: 2023-10-26 | Discharge: 2023-10-26 | Disposition: A | Discharge status: Home / Self Care | Source: Ambulatory Visit | Attending: Internal Medicine | Admitting: Internal Medicine

## 2023-10-26 ENCOUNTER — Ambulatory Visit: Payer: Self-pay | Admitting: Internal Medicine

## 2023-10-26 DIAGNOSIS — M79604 Pain in right leg: Secondary | ICD-10-CM | POA: Diagnosis not present

## 2023-10-26 DIAGNOSIS — M79605 Pain in left leg: Secondary | ICD-10-CM | POA: Diagnosis not present

## 2023-10-26 NOTE — Progress Notes (Signed)
 Remote Loop Recorder Transmission

## 2023-11-19 ENCOUNTER — Ambulatory Visit: Payer: Self-pay | Admitting: Internal Medicine

## 2023-12-05 DIAGNOSIS — L821 Other seborrheic keratosis: Secondary | ICD-10-CM | POA: Diagnosis not present

## 2023-12-05 DIAGNOSIS — L570 Actinic keratosis: Secondary | ICD-10-CM | POA: Diagnosis not present

## 2023-12-05 DIAGNOSIS — Z85828 Personal history of other malignant neoplasm of skin: Secondary | ICD-10-CM | POA: Diagnosis not present

## 2023-12-05 DIAGNOSIS — L814 Other melanin hyperpigmentation: Secondary | ICD-10-CM | POA: Diagnosis not present

## 2023-12-05 DIAGNOSIS — D225 Melanocytic nevi of trunk: Secondary | ICD-10-CM | POA: Diagnosis not present

## 2023-12-10 ENCOUNTER — Encounter: Payer: Self-pay | Admitting: Internal Medicine

## 2024-01-19 ENCOUNTER — Ambulatory Visit: Attending: Cardiology

## 2024-01-19 DIAGNOSIS — I495 Sick sinus syndrome: Secondary | ICD-10-CM | POA: Diagnosis not present

## 2024-01-22 LAB — CUP PACEART REMOTE DEVICE CHECK
Battery Voltage: 80
Date Time Interrogation Session: 20260116095437
Implantable Lead Connection Status: 753985
Implantable Lead Connection Status: 753985
Implantable Lead Implant Date: 20231018
Implantable Lead Implant Date: 20231018
Implantable Lead Location: 753859
Implantable Lead Location: 753860
Implantable Lead Model: 377
Implantable Lead Model: 377
Implantable Lead Serial Number: 8001068059
Implantable Lead Serial Number: 8001109171
Implantable Pulse Generator Implant Date: 20231018
Pulse Gen Model: 407145
Pulse Gen Serial Number: 1000094833

## 2024-01-23 ENCOUNTER — Ambulatory Visit: Payer: Self-pay | Admitting: Cardiology

## 2024-01-25 NOTE — Progress Notes (Signed)
 Remote PPM Transmission

## 2024-02-28 ENCOUNTER — Ambulatory Visit: Admitting: Cardiology

## 2024-04-19 ENCOUNTER — Ambulatory Visit

## 2024-07-19 ENCOUNTER — Ambulatory Visit

## 2024-10-18 ENCOUNTER — Ambulatory Visit

## 2025-01-17 ENCOUNTER — Ambulatory Visit

## 2025-04-18 ENCOUNTER — Ambulatory Visit
